# Patient Record
Sex: Female | Born: 1960
Health system: Southern US, Community
[De-identification: ages and names within clinical notes are randomized; demographics above are authoritative.]

## PROBLEM LIST (undated history)

## (undated) DIAGNOSIS — Z720 Tobacco use: Secondary | ICD-10-CM

## (undated) DIAGNOSIS — J449 Chronic obstructive pulmonary disease, unspecified: Secondary | ICD-10-CM

## (undated) DIAGNOSIS — I213 ST elevation (STEMI) myocardial infarction of unspecified site: Secondary | ICD-10-CM

## (undated) DIAGNOSIS — I10 Essential (primary) hypertension: Secondary | ICD-10-CM

## (undated) DIAGNOSIS — E785 Hyperlipidemia, unspecified: Secondary | ICD-10-CM

---

## 2003-06-13 ENCOUNTER — Emergency Department (HOSPITAL_COMMUNITY): Admission: EM | Admit: 2003-06-13 | Discharge: 2003-06-13 | Payer: Self-pay | Admitting: Emergency Medicine

## 2005-06-30 ENCOUNTER — Emergency Department (HOSPITAL_COMMUNITY): Admission: EM | Admit: 2005-06-30 | Discharge: 2005-06-30 | Payer: Self-pay | Admitting: Family Medicine

## 2006-06-14 ENCOUNTER — Emergency Department (HOSPITAL_COMMUNITY): Admission: EM | Admit: 2006-06-14 | Discharge: 2006-06-14 | Payer: Self-pay | Admitting: Emergency Medicine

## 2009-12-26 ENCOUNTER — Emergency Department (HOSPITAL_COMMUNITY): Admission: EM | Admit: 2009-12-26 | Discharge: 2009-12-26 | Payer: Self-pay | Admitting: Emergency Medicine

## 2010-01-05 ENCOUNTER — Emergency Department (HOSPITAL_COMMUNITY): Admission: EM | Admit: 2010-01-05 | Discharge: 2010-01-05 | Payer: Self-pay | Admitting: Emergency Medicine

## 2011-04-22 ENCOUNTER — Emergency Department (HOSPITAL_COMMUNITY)
Admission: EM | Admit: 2011-04-22 | Discharge: 2011-04-23 | Disposition: A | Discharge status: Home / Self Care | Payer: Self-pay | Attending: Emergency Medicine | Admitting: Emergency Medicine

## 2011-04-22 ENCOUNTER — Emergency Department (HOSPITAL_COMMUNITY): Payer: Self-pay

## 2011-04-22 ENCOUNTER — Other Ambulatory Visit: Payer: Self-pay

## 2011-04-22 ENCOUNTER — Encounter: Payer: Self-pay | Admitting: *Deleted

## 2011-04-22 DIAGNOSIS — R0602 Shortness of breath: Secondary | ICD-10-CM | POA: Insufficient documentation

## 2011-04-22 DIAGNOSIS — R05 Cough: Secondary | ICD-10-CM | POA: Insufficient documentation

## 2011-04-22 DIAGNOSIS — R079 Chest pain, unspecified: Secondary | ICD-10-CM | POA: Insufficient documentation

## 2011-04-22 DIAGNOSIS — J40 Bronchitis, not specified as acute or chronic: Secondary | ICD-10-CM | POA: Insufficient documentation

## 2011-04-22 DIAGNOSIS — R059 Cough, unspecified: Secondary | ICD-10-CM | POA: Insufficient documentation

## 2011-04-22 LAB — DIFFERENTIAL
Eosinophils Relative: 0 % (ref 0–5)
Lymphocytes Relative: 10 % — ABNORMAL LOW (ref 12–46)
Lymphs Abs: 0.8 10*3/uL (ref 0.7–4.0)
Neutro Abs: 6.6 10*3/uL (ref 1.7–7.7)

## 2011-04-22 LAB — CBC
HCT: 47.4 % — ABNORMAL HIGH (ref 36.0–46.0)
MCV: 91.7 fL (ref 78.0–100.0)
Platelets: 248 10*3/uL (ref 150–400)
RBC: 5.17 MIL/uL — ABNORMAL HIGH (ref 3.87–5.11)
WBC: 8.2 10*3/uL (ref 4.0–10.5)

## 2011-04-22 NOTE — ED Notes (Signed)
Pt in c/o cough and shortness of breath since yesterday, states coughing up green sputum, pt speaking in full sentences, VS stable, no acute distress

## 2011-04-23 LAB — COMPREHENSIVE METABOLIC PANEL
ALT: 28 U/L (ref 0–35)
AST: 59 U/L — ABNORMAL HIGH (ref 0–37)
Alkaline Phosphatase: 119 U/L — ABNORMAL HIGH (ref 39–117)
CO2: 22 mEq/L (ref 19–32)
Calcium: 9.3 mg/dL (ref 8.4–10.5)
Chloride: 100 mEq/L (ref 96–112)
GFR calc Af Amer: 88 mL/min — ABNORMAL LOW (ref >90)
GFR calc non Af Amer: 76 mL/min — ABNORMAL LOW (ref >90)
Glucose, Bld: 104 mg/dL — ABNORMAL HIGH (ref 70–99)
Sodium: 133 mEq/L — ABNORMAL LOW (ref 135–145)
Total Bilirubin: 0.2 mg/dL — ABNORMAL LOW (ref 0.3–1.2)

## 2011-04-23 MED ORDER — ALBUTEROL SULFATE HFA 108 (90 BASE) MCG/ACT IN AERS
2.0000 | INHALATION_SPRAY | RESPIRATORY_TRACT | Status: DC | PRN
  Filled 2011-04-23: qty 6.7

## 2011-04-23 MED ORDER — ALBUTEROL SULFATE (5 MG/ML) 0.5% IN NEBU
5.0000 mg | INHALATION_SOLUTION | Freq: Once | RESPIRATORY_TRACT | Status: AC
  Administered 2011-04-23: 5 mg via RESPIRATORY_TRACT
  Filled 2011-04-23 (×2): qty 0.5

## 2011-04-23 MED ORDER — IPRATROPIUM BROMIDE 0.02 % IN SOLN
0.5000 mg | Freq: Once | RESPIRATORY_TRACT | Status: AC
  Administered 2011-04-23: 0.5 mg via RESPIRATORY_TRACT
  Filled 2011-04-23: qty 2.5

## 2011-04-23 MED ORDER — ACETAMINOPHEN 325 MG PO TABS
650.0000 mg | ORAL_TABLET | Freq: Once | ORAL | Status: AC
  Administered 2011-04-23: 650 mg via ORAL
  Filled 2011-04-23: qty 2

## 2011-04-23 NOTE — ED Provider Notes (Signed)
History     CSN: [id number] Arrival date & time: 04/22/2011  8:48 PM   First MD Initiated Contact with Patient 04/23/11 0235      Chief Complaint  Patient presents with  . Cough    (Consider location/radiation/quality/duration/timing/severity/associated sxs/prior treatment) HPI This is a 50 year old black female with a three-day history of cough, shortness of breath, chest tightness, chest soreness, fever, body aches, posttussive emesis and diarrhea. The cough or shortness of breath are moderate to severe and worsened since yesterday. It has not been relieved by over-the-counter medications.  History reviewed. No pertinent past medical history.  History reviewed. No pertinent past surgical history.  History reviewed. No pertinent family history.  History  Substance Use Topics  . Smoking status: Current Everyday Smoker  . Smokeless tobacco: Not on file  . Alcohol Use: Yes    OB History    Grav Para Term Preterm Abortions TAB SAB Ect Mult Living                  Review of Systems  All other systems reviewed and are negative.     Allergies  Review of patient's allergies indicates no known allergies.  Home Medications   Current Outpatient Rx  Name Route Sig Dispense Refill  . ACETAMINOPHEN 325 MG PO TABS Oral Take 325 mg by mouth every 6 (six) hours as needed. Pain relief     . DEXTROMETHORPHAN HBR 15 MG/5ML PO SYRP Oral Take 10 mLs by mouth 4 (four) times daily as needed.      Marland Kitchen DM-GUAIFENESIN ER 30-600 MG PO TB12 Oral Take 1 tablet by mouth every 12 (twelve) hours.        BP 139/99  Pulse 105  Temp(Src) 100 F (37.8 C) (Oral)  Resp 24  SpO2 99%  Physical Exam General: Well-developed, well-nourished female in no acute distress; appears older than age of record HENT: normocephalic, atraumatic Eyes: pupils equal round and reactive to light; extraocular muscles intact; periocular xanthomas Neck: supple Heart: regular rate and rhythm Lungs: Decreased air  movement without frank wheezing Chest: Chest tenderness on palpation of sternum and parasternal regions Abdomen: soft; nontender; nondistended Extremities: No deformity; full range of motion; pulses normal Neurologic: Awake, alert; motor function intact in all extremities and symmetric; no facial droop Skin: Warm and dry    ED Course  Procedures (including critical care time)    MDM   Nursing notes and vitals signs, including pulse oximetry, reviewed.  Summary of this visit's results, reviewed by myself:  Labs:  Results for orders placed during the hospital encounter of 04/22/11  CBC      Component Value Range   WBC 8.2  4.0 - 10.5 (K/uL)   RBC 5.17 (*) 3.87 - 5.11 (MIL/uL)   Hemoglobin 16.3 (*) 12.0 - 15.0 (g/dL)   HCT 86.5 (*) 78.4 - 46.0 (%)   MCV 91.7  78.0 - 100.0 (fL)   MCH 31.5  26.0 - 34.0 (pg)   MCHC 34.4  30.0 - 36.0 (g/dL)   RDW 69.6  29.5 - 28.4 (%)   Platelets 248  150 - 400 (K/uL)  DIFFERENTIAL      Component Value Range   Neutrophils Relative 80 (*) 43 - 77 (%)   Neutro Abs 6.6  1.7 - 7.7 (K/uL)   Lymphocytes Relative 10 (*) 12 - 46 (%)   Lymphs Abs 0.8  0.7 - 4.0 (K/uL)   Monocytes Relative 9  3 - 12 (%)   Monocytes Absolute  0.8  0.1 - 1.0 (K/uL)   Eosinophils Relative 0  0 - 5 (%)   Eosinophils Absolute 0.0  0.0 - 0.7 (K/uL)   Basophils Relative 0  0 - 1 (%)   Basophils Absolute 0.0  0.0 - 0.1 (K/uL)  COMPREHENSIVE METABOLIC PANEL      Component Value Range   Sodium 133 (*) 135 - 145 (mEq/L)   Potassium 3.8  3.5 - 5.1 (mEq/L)   Chloride 100  96 - 112 (mEq/L)   CO2 22  19 - 32 (mEq/L)   Glucose, Bld 104 (*) 70 - 99 (mg/dL)   BUN 8  6 - 23 (mg/dL)   Creatinine, Ser 3.08  0.50 - 1.10 (mg/dL)   Calcium 9.3  8.4 - 65.7 (mg/dL)   Total Protein 8.3  6.0 - 8.3 (g/dL)   Albumin 4.0  3.5 - 5.2 (g/dL)   AST 59 (*) 0 - 37 (U/L)   ALT 28  0 - 35 (U/L)   Alkaline Phosphatase 119 (*) 39 - 117 (U/L)   Total Bilirubin 0.2 (*) 0.3 - 1.2 (mg/dL)   GFR calc  non Af Amer 76 (*) >90 (mL/min)   GFR calc Af Amer 88 (*) >90 (mL/min)    Imaging Studies: Dg Chest 2 View  04/22/2011  *RADIOLOGY REPORT*  Clinical Data: Shortness of breath.  Chest pain and cough.  CHEST - 2 VIEW  Comparison: None.  Findings: The lungs are clear without focal consolidation, edema, effusion or pneumothorax.  Cardiopericardial silhouette is within normal limits for size.  Imaged bony structures of the thorax are intact.  IMPRESSION: No acute findings.  Original Report Authenticated By: ERIC A. MANSELL, M.D.   3:43 AM Air movement improved, patient feels much better after albuterol and Atrovent neb treatment.          Hanley Seamen, MD 04/23/11 [phone]

## 2012-08-23 ENCOUNTER — Emergency Department (HOSPITAL_COMMUNITY)
Admission: EM | Admit: 2012-08-23 | Discharge: 2012-08-23 | Disposition: A | Discharge status: Home / Self Care | Payer: Self-pay | Attending: Emergency Medicine | Admitting: Emergency Medicine

## 2012-08-23 ENCOUNTER — Encounter (HOSPITAL_COMMUNITY): Payer: Self-pay | Admitting: Emergency Medicine

## 2012-08-23 ENCOUNTER — Emergency Department (HOSPITAL_COMMUNITY): Payer: Self-pay

## 2012-08-23 DIAGNOSIS — R079 Chest pain, unspecified: Secondary | ICD-10-CM | POA: Insufficient documentation

## 2012-08-23 DIAGNOSIS — J9801 Acute bronchospasm: Secondary | ICD-10-CM

## 2012-08-23 DIAGNOSIS — R05 Cough: Secondary | ICD-10-CM | POA: Insufficient documentation

## 2012-08-23 DIAGNOSIS — R0602 Shortness of breath: Secondary | ICD-10-CM | POA: Insufficient documentation

## 2012-08-23 DIAGNOSIS — J40 Bronchitis, not specified as acute or chronic: Secondary | ICD-10-CM

## 2012-08-23 DIAGNOSIS — R059 Cough, unspecified: Secondary | ICD-10-CM | POA: Insufficient documentation

## 2012-08-23 DIAGNOSIS — J209 Acute bronchitis, unspecified: Secondary | ICD-10-CM | POA: Insufficient documentation

## 2012-08-23 DIAGNOSIS — R062 Wheezing: Secondary | ICD-10-CM | POA: Insufficient documentation

## 2012-08-23 DIAGNOSIS — R093 Abnormal sputum: Secondary | ICD-10-CM | POA: Insufficient documentation

## 2012-08-23 DIAGNOSIS — F17200 Nicotine dependence, unspecified, uncomplicated: Secondary | ICD-10-CM | POA: Insufficient documentation

## 2012-08-23 DIAGNOSIS — R1110 Vomiting, unspecified: Secondary | ICD-10-CM | POA: Insufficient documentation

## 2012-08-23 DIAGNOSIS — Z79899 Other long term (current) drug therapy: Secondary | ICD-10-CM | POA: Insufficient documentation

## 2012-08-23 LAB — CBC WITH DIFFERENTIAL/PLATELET
Basophils Absolute: 0 10*3/uL (ref 0.0–0.1)
Basophils Relative: 0 % (ref 0–1)
Eosinophils Absolute: 0.2 10*3/uL (ref 0.0–0.7)
Hemoglobin: 16.7 g/dL — ABNORMAL HIGH (ref 12.0–15.0)
MCH: 33.6 pg (ref 26.0–34.0)
MCHC: 36.5 g/dL — ABNORMAL HIGH (ref 30.0–36.0)
Neutro Abs: 3.8 10*3/uL (ref 1.7–7.7)
Neutrophils Relative %: 66 % (ref 43–77)
Platelets: 288 10*3/uL (ref 150–400)
RDW: 14.2 % (ref 11.5–15.5)

## 2012-08-23 LAB — COMPREHENSIVE METABOLIC PANEL
AST: 20 U/L (ref 0–37)
Albumin: 3.8 g/dL (ref 3.5–5.2)
Alkaline Phosphatase: 112 U/L (ref 39–117)
Chloride: 106 mEq/L (ref 96–112)
Potassium: 3.9 mEq/L (ref 3.5–5.1)
Sodium: 142 mEq/L (ref 135–145)
Total Bilirubin: 0.2 mg/dL — ABNORMAL LOW (ref 0.3–1.2)
Total Protein: 7.4 g/dL (ref 6.0–8.3)

## 2012-08-23 LAB — POCT I-STAT TROPONIN I: Troponin i, poc: 0 ng/mL (ref 0.00–0.08)

## 2012-08-23 MED ORDER — GUAIFENESIN-CODEINE 100-10 MG/5ML PO SYRP: 5.0000 mL | ORAL_SOLUTION | Freq: Three times a day (TID) | ORAL | Status: DC | PRN

## 2012-08-23 MED ORDER — PREDNISONE 20 MG PO TABS
60.0000 mg | ORAL_TABLET | Freq: Once | ORAL | Status: AC
  Administered 2012-08-23: 60 mg via ORAL
  Filled 2012-08-23: qty 3

## 2012-08-23 MED ORDER — ALBUTEROL SULFATE HFA 108 (90 BASE) MCG/ACT IN AERS
2.0000 | INHALATION_SPRAY | Freq: Once | RESPIRATORY_TRACT | Status: AC
  Administered 2012-08-23: 2 via RESPIRATORY_TRACT
  Filled 2012-08-23: qty 6.7

## 2012-08-23 MED ORDER — GUAIFENESIN-CODEINE 100-10 MG/5ML PO SOLN
5.0000 mL | Freq: Once | ORAL | Status: AC
  Administered 2012-08-23: 5 mL via ORAL
  Filled 2012-08-23: qty 5

## 2012-08-23 MED ORDER — ALBUTEROL SULFATE (5 MG/ML) 0.5% IN NEBU
5.0000 mg | INHALATION_SOLUTION | Freq: Once | RESPIRATORY_TRACT | Status: AC
  Administered 2012-08-23: 5 mg via RESPIRATORY_TRACT
  Filled 2012-08-23: qty 1

## 2012-08-23 MED ORDER — IPRATROPIUM BROMIDE 0.02 % IN SOLN
0.5000 mg | Freq: Once | RESPIRATORY_TRACT | Status: AC
  Administered 2012-08-23: 0.5 mg via RESPIRATORY_TRACT
  Filled 2012-08-23: qty 2.5

## 2012-08-23 MED ORDER — AZITHROMYCIN 250 MG PO TABS: ORAL_TABLET | ORAL | Status: DC

## 2012-08-23 NOTE — ED Provider Notes (Signed)
History     CSN: [id number]  Arrival date & time 08/23/12  1945   First MD Initiated Contact with Patient 08/23/12 1955      Chief Complaint  Patient presents with  . Cough  . Chest Pain    (Consider location/radiation/quality/duration/timing/severity/associated sxs/prior treatment) HPI Comments: 52 y/o female with no significant PMHx presents to the ED complaining of persistent productive cough with green mucus, wheezing and shortness of breath x 4 days. States she occasionally coughs so much it causes her to vomit. Denies chest pain, just soreness and tightness with coughing. No history of asthma. She tried applying a warm moist cloth over her face without relief. Denies fever, chills, nausea, abdominal pain, lightheadedness, dizziness. No sick contacts.  Patient is a 52 y.o. female presenting with cough and chest pain. The history is provided by the patient.  Cough Associated symptoms: shortness of breath and wheezing   Associated symptoms: no chest pain, no chills and no fever   Chest Pain Associated symptoms: cough, shortness of breath and vomiting   Associated symptoms: no abdominal pain, no dizziness, no fever and no nausea     History reviewed. No pertinent past medical history.  History reviewed. No pertinent past surgical history.  No family history on file.  History  Substance Use Topics  . Smoking status: Current Every Day Smoker  . Smokeless tobacco: Not on file  . Alcohol Use: Yes    OB History   Grav Para Term Preterm Abortions TAB SAB Ect Mult Living                  Review of Systems  Constitutional: Negative for fever and chills.  Respiratory: Positive for cough, chest tightness, shortness of breath and wheezing.   Cardiovascular: Negative for chest pain.  Gastrointestinal: Positive for vomiting. Negative for nausea and abdominal pain.  Neurological: Negative for dizziness and light-headedness.  All other systems reviewed and are  negative.    Allergies  Review of patient's allergies indicates no known allergies.  Home Medications   Current Outpatient Rx  Name  Route  Sig  Dispense  Refill  . acetaminophen (TYLENOL) 325 MG tablet   Oral   Take 325 mg by mouth every 6 (six) hours as needed. Pain relief          . dextromethorphan 15 MG/5ML syrup   Oral   Take 10 mLs by mouth 4 (four) times daily as needed.           Marland Kitchen dextromethorphan-guaiFENesin (MUCINEX DM) 30-600 MG per 12 hr tablet   Oral   Take 1 tablet by mouth every 12 (twelve) hours.             BP 124/89  Pulse 97  Temp(Src) 98.7 F (37.1 C) (Oral)  Resp 18  SpO2 98%  Physical Exam  Nursing note and vitals reviewed. Constitutional: She is oriented to person, place, and time. She appears well-developed and well-nourished.  Harsh wet sounding cough present throughout exam, tearful.  HENT:  Head: Normocephalic and atraumatic.  Mouth/Throat: Posterior oropharyngeal erythema present. No oropharyngeal exudate or posterior oropharyngeal edema.  Eyes: Conjunctivae are normal.  Neck: Normal range of motion. Neck supple.  Cardiovascular: Normal rate, regular rhythm, normal heart sounds and intact distal pulses.   Pulmonary/Chest: Effort normal. No accessory muscle usage. No respiratory distress. She has no decreased breath sounds. She has wheezes (scattered inspiratory and expiratory). She has rhonchi (scattered).  Abdominal: Soft. Bowel sounds are normal. There is  no tenderness.  Musculoskeletal: Normal range of motion. She exhibits no edema.  Lymphadenopathy:    She has no cervical adenopathy.  Neurological: She is alert and oriented to person, place, and time.  Skin: Skin is warm and dry. She is not diaphoretic.  Psychiatric: She has a normal mood and affect. Her speech is normal and behavior is normal.    ED Course  Procedures (including critical care time)  Labs Reviewed  CBC WITH DIFFERENTIAL - Abnormal; Notable for the  following:    Hemoglobin 16.7 (*)    MCHC 36.5 (*)    All other components within normal limits  COMPREHENSIVE METABOLIC PANEL - Abnormal; Notable for the following:    Total Bilirubin 0.2 (*)    GFR calc non Af Amer 75 (*)    GFR calc Af Amer 87 (*)    All other components within normal limits  POCT I-STAT TROPONIN I    Date: 08/23/2012  Rate: 100  Rhythm: sinus tachycardia  QRS Axis: normal  Intervals: normal  ST/T Wave abnormalities: nonspecific T wave changes  Conduction Disutrbances:none  Narrative Interpretation: no changes since last EKG tracing 04/2011  Old EKG Reviewed: unchanged   Dg Chest 2 View  08/23/2012  *RADIOLOGY REPORT*  Clinical Data: Cough, chest pain  CHEST - 2 VIEW  Comparison: 04/22/2011  Findings: Cardiomediastinal silhouette is stable.  No acute infiltrate or pleural effusion No pulmonary edema.  Central mild bronchitic changes.  Bony thorax is stable.  IMPRESSION: No acute infiltrate or pulmonary edema.  Central mild bronchitic changes.   Original Report Authenticated By: Natasha Mead, M.D.      1. Bronchitis   2. Bronchospasm   3. Cough       MDM  52 y/o female with cough and wheezing. Inspiratory/expiratory wheezes and ronchi present on exam. Actively coughing with harsh, wet sounding cough. Will give duo-neb, prednisone along with guaifenesin for cough and re-assess. 9:58 PM Marked clinical improvement noted with breathing treatment, prednisone and guaifenesin. Decreased wheezes/ronchi on exam, however still mildly present scattered. Vitals stable. Afebrile. Rx albuterol inhaler, zithromax, guaifenesin. Return precautions discussed. Resource guide given for PCP follow up. Patient states understanding of plan and is agreeable.        Trevor Mace, PA-C 08/23/12 2159

## 2012-08-23 NOTE — ED Notes (Signed)
PT. REPORTS PERSISTENT PRODUCTIVE COUGH WITH SOB AND MID CHEST PAIN FOR SEVERAL DAYS , CHEST PAIN WORSE WHEN COUGHING , ALSO REPORTS OCCASIONAL EMESIS.

## 2012-08-25 NOTE — ED Provider Notes (Signed)
Medical screening examination/treatment/procedure(s) were performed by non-physician practitioner and as supervising physician I was immediately available for consultation/collaboration.   Laray Anger, DO 08/25/12 [phone]

## 2013-06-01 ENCOUNTER — Emergency Department (HOSPITAL_COMMUNITY)
Admission: EM | Admit: 2013-06-01 | Discharge: 2013-06-02 | Disposition: A | Discharge status: Home / Self Care | Payer: Self-pay | Attending: Emergency Medicine | Admitting: Emergency Medicine

## 2013-06-01 ENCOUNTER — Encounter (HOSPITAL_COMMUNITY): Payer: Self-pay | Admitting: Emergency Medicine

## 2013-06-01 DIAGNOSIS — K5289 Other specified noninfective gastroenteritis and colitis: Secondary | ICD-10-CM | POA: Insufficient documentation

## 2013-06-01 DIAGNOSIS — R109 Unspecified abdominal pain: Secondary | ICD-10-CM

## 2013-06-01 DIAGNOSIS — F17200 Nicotine dependence, unspecified, uncomplicated: Secondary | ICD-10-CM | POA: Insufficient documentation

## 2013-06-01 DIAGNOSIS — K529 Noninfective gastroenteritis and colitis, unspecified: Secondary | ICD-10-CM

## 2013-06-01 LAB — COMPREHENSIVE METABOLIC PANEL
ALT: 17 U/L (ref 0–35)
AST: 15 U/L (ref 0–37)
Albumin: 3.8 g/dL (ref 3.5–5.2)
Alkaline Phosphatase: 116 U/L (ref 39–117)
BUN: 11 mg/dL (ref 6–23)
CHLORIDE: 99 meq/L (ref 96–112)
CO2: 22 meq/L (ref 19–32)
Calcium: 9.5 mg/dL (ref 8.4–10.5)
Creatinine, Ser: 0.83 mg/dL (ref 0.50–1.10)
GFR calc Af Amer: >90 mL/min (ref >90)
GFR, EST NON AFRICAN AMERICAN: 79 mL/min — AB (ref >90)
Glucose, Bld: 110 mg/dL — ABNORMAL HIGH (ref 70–99)
Potassium: 4.1 mEq/L (ref 3.7–5.3)
SODIUM: 138 meq/L (ref 137–147)
Total Protein: 8.3 g/dL (ref 6.0–8.3)

## 2013-06-01 LAB — CBC WITH DIFFERENTIAL/PLATELET
Basophils Absolute: 0 10*3/uL (ref 0.0–0.1)
Basophils Relative: 0 % (ref 0–1)
Eosinophils Absolute: 0.1 10*3/uL (ref 0.0–0.7)
Eosinophils Relative: 1 % (ref 0–5)
HCT: 47.5 % — ABNORMAL HIGH (ref 36.0–46.0)
Hemoglobin: 16.9 g/dL — ABNORMAL HIGH (ref 12.0–15.0)
LYMPHS PCT: 17 % (ref 12–46)
Lymphs Abs: 1.7 10*3/uL (ref 0.7–4.0)
MCH: 32.9 pg (ref 26.0–34.0)
MCHC: 35.6 g/dL (ref 30.0–36.0)
MCV: 92.6 fL (ref 78.0–100.0)
Monocytes Absolute: 0.8 10*3/uL (ref 0.1–1.0)
Monocytes Relative: 8 % (ref 3–12)
NEUTROS ABS: 7.3 10*3/uL (ref 1.7–7.7)
NEUTROS PCT: 73 % (ref 43–77)
PLATELETS: 334 10*3/uL (ref 150–400)
RBC: 5.13 MIL/uL — ABNORMAL HIGH (ref 3.87–5.11)
RDW: 13.7 % (ref 11.5–15.5)
WBC: 10 10*3/uL (ref 4.0–10.5)

## 2013-06-01 LAB — URINALYSIS, ROUTINE W REFLEX MICROSCOPIC
Bilirubin Urine: NEGATIVE
GLUCOSE, UA: NEGATIVE mg/dL
KETONES UR: NEGATIVE mg/dL
Nitrite: NEGATIVE
Protein, ur: NEGATIVE mg/dL
Specific Gravity, Urine: 1.022 (ref 1.005–1.030)
Urobilinogen, UA: 1 mg/dL (ref 0.0–1.0)
pH: 5.5 (ref 5.0–8.0)

## 2013-06-01 LAB — URINE MICROSCOPIC-ADD ON

## 2013-06-01 LAB — LACTIC ACID, PLASMA: LACTIC ACID, VENOUS: 1.1 mmol/L (ref 0.5–2.2)

## 2013-06-01 LAB — LIPASE, BLOOD: Lipase: 21 U/L (ref 11–59)

## 2013-06-01 MED ORDER — MORPHINE SULFATE 4 MG/ML IJ SOLN
4.0000 mg | Freq: Once | INTRAMUSCULAR | Status: AC
  Administered 2013-06-01: 4 mg via INTRAVENOUS
  Filled 2013-06-01: qty 1

## 2013-06-01 MED ORDER — ONDANSETRON HCL 4 MG/2ML IJ SOLN
4.0000 mg | Freq: Once | INTRAMUSCULAR | Status: AC
  Administered 2013-06-01: 4 mg via INTRAVENOUS
  Filled 2013-06-01: qty 2

## 2013-06-01 MED ORDER — FAMOTIDINE IN NACL 20-0.9 MG/50ML-% IV SOLN
20.0000 mg | Freq: Once | INTRAVENOUS | Status: AC
  Administered 2013-06-01: 20 mg via INTRAVENOUS
  Filled 2013-06-01: qty 50

## 2013-06-01 MED ORDER — SODIUM CHLORIDE 0.9 % IV BOLUS (SEPSIS)
1000.0000 mL | Freq: Once | INTRAVENOUS | Status: AC
  Administered 2013-06-01: 1000 mL via INTRAVENOUS

## 2013-06-01 NOTE — ED Provider Notes (Signed)
CSN: [id number]     Arrival date & time 06/01/13  1925 History   First MD Initiated Contact with Patient 06/01/13 2257     Chief Complaint  Patient presents with  . Abdominal Pain   (Consider location/radiation/quality/duration/timing/severity/associated sxs/prior Treatment) HPI Patient is a 53 yo woman with PMH significant only for asthma. She presents with c/o 4 days of RLQ abdominal pain. Pain was initially intermittent. But, has been constant and more severe over the past 2 days. Pain is severe. It is nonradiating. The patient feels worse when she lays down and better when she is sitting up. She has been intermittently nauseated and had a single episode of NBNB emesis today.   Her last BM was 4d ago. She notes she has been passing quite a bit of flatus. She denies history of similar sx. No history of abdominal surgeries. Patient denies GU sx.   History reviewed. No pertinent past medical history. History reviewed. No pertinent past surgical history. No family history on file. History  Substance Use Topics  . Smoking status: Current Every Day Smoker -- 0.50 packs/day    Types: Cigarettes  . Smokeless tobacco: Not on file  . Alcohol Use: Yes   OB History   Grav Para Term Preterm Abortions TAB SAB Ect Mult Living                 Review of Systems Ten point review of symptoms performed and is negative with the exception of symptoms noted above.   Allergies  Review of patient's allergies indicates no known allergies.  Home Medications   Current Outpatient Rx  Name  Route  Sig  Dispense  Refill  . acetaminophen (TYLENOL) 325 MG tablet   Oral   Take 325 mg by mouth every 6 (six) hours as needed. Pain relief          . albuterol (PROVENTIL HFA;VENTOLIN HFA) 108 (90 BASE) MCG/ACT inhaler   Inhalation   Inhale 2 puffs into the lungs every 6 (six) hours as needed for wheezing.         Marland Kitchen ibuprofen (ADVIL,MOTRIN) 200 MG tablet   Oral   Take 600 mg by mouth every 6 (six)  hours as needed for pain.          BP 139/86  Pulse 107  Temp(Src) 97.6 F (36.4 C) (Oral)  Resp 18  Ht 5\' 1"  (1.549 m)  Wt 149 lb 12.8 oz (67.949 kg)  BMI 28.32 kg/m2  SpO2 99% Physical Exam Gen: well developed and well nourished appearing Head: NCAT Eyes: PERL, EOMI Nose: no epistaixis or rhinorrhea Mouth/throat: mucosa is moist and pink Neck: supple, no stridor Lungs: CTA B, no wheezing, rhonchi or rales CV: Rapid and regular, pulse 104 bpm, no murmur, extremities appear well perfused.  Abd: soft, tender over the RLQ and, to a lesser extent, over the RUQ, obese, nondistended Back: no ttp, no cva ttp Skin: warm and dry Ext: normal to inspection, no dependent edema Neuro: CN ii-xii grossly intact, no focal deficits Psyche; normal affect,  calm and cooperative.   ED Course  Procedures (including critical care time) Labs Review  Results for orders placed during the hospital encounter of 06/01/13 (from the past 24 hour(s))  CBC WITH DIFFERENTIAL     Status: Abnormal   Collection Time    06/01/13  7:45 PM      Result Value Range   WBC 10.0  4.0 - 10.5 K/uL   RBC 5.13 (*) 3.87 -  5.11 MIL/uL   Hemoglobin 16.9 (*) 12.0 - 15.0 g/dL   HCT 46.5 (*) 03.5 - 46.5 %   MCV 92.6  78.0 - 100.0 fL   MCH 32.9  26.0 - 34.0 pg   MCHC 35.6  30.0 - 36.0 g/dL   RDW 68.1  27.5 - 17.0 %   Platelets 334  150 - 400 K/uL   Neutrophils Relative % 73  43 - 77 %   Neutro Abs 7.3  1.7 - 7.7 K/uL   Lymphocytes Relative 17  12 - 46 %   Lymphs Abs 1.7  0.7 - 4.0 K/uL   Monocytes Relative 8  3 - 12 %   Monocytes Absolute 0.8  0.1 - 1.0 K/uL   Eosinophils Relative 1  0 - 5 %   Eosinophils Absolute 0.1  0.0 - 0.7 K/uL   Basophils Relative 0  0 - 1 %   Basophils Absolute 0.0  0.0 - 0.1 K/uL  COMPREHENSIVE METABOLIC PANEL     Status: Abnormal   Collection Time    06/01/13  7:45 PM      Result Value Range   Sodium 138  137 - 147 mEq/L   Potassium 4.1  3.7 - 5.3 mEq/L   Chloride 99  96 - 112  mEq/L   CO2 22  19 - 32 mEq/L   Glucose, Bld 110 (*) 70 - 99 mg/dL   BUN 11  6 - 23 mg/dL   Creatinine, Ser 0.17  0.50 - 1.10 mg/dL   Calcium 9.5  8.4 - 49.4 mg/dL   Total Protein 8.3  6.0 - 8.3 g/dL   Albumin 3.8  3.5 - 5.2 g/dL   AST 15  0 - 37 U/L   ALT 17  0 - 35 U/L   Alkaline Phosphatase 116  39 - 117 U/L   Total Bilirubin <0.2 (*) 0.3 - 1.2 mg/dL   GFR calc non Af Amer 79 (*) >90 mL/min   GFR calc Af Amer >90  >90 mL/min  LIPASE, BLOOD     Status: None   Collection Time    06/01/13  7:45 PM      Result Value Range   Lipase 21  11 - 59 U/L  URINALYSIS, ROUTINE W REFLEX MICROSCOPIC     Status: Abnormal   Collection Time    06/01/13 10:36 PM      Result Value Range   Color, Urine YELLOW  YELLOW   APPearance CLOUDY (*) CLEAR   Specific Gravity, Urine 1.022  1.005 - 1.030   pH 5.5  5.0 - 8.0   Glucose, UA NEGATIVE  NEGATIVE mg/dL   Hgb urine dipstick SMALL (*) NEGATIVE   Bilirubin Urine NEGATIVE  NEGATIVE   Ketones, ur NEGATIVE  NEGATIVE mg/dL   Protein, ur NEGATIVE  NEGATIVE mg/dL   Urobilinogen, UA 1.0  0.0 - 1.0 mg/dL   Nitrite NEGATIVE  NEGATIVE   Leukocytes, UA MODERATE (*) NEGATIVE  URINE MICROSCOPIC-ADD ON     Status: Abnormal   Collection Time    06/01/13 10:36 PM      Result Value Range   Squamous Epithelial / LPF FEW (*) RARE   WBC, UA 11-20  <3 WBC/hpf   RBC / HPF 0-2  <3 RBC/hpf   Bacteria, UA FEW (*) RARE   Urine-Other MUCOUS PRESENT    LACTIC ACID, PLASMA     Status: None   Collection Time    06/01/13 11:22 PM  Result Value Range   Lactic Acid, Venous 1.1  0.5 - 2.2 mmol/L   CT Abdomen Pelvis W Contrast (Final result)  Result time: 06/02/13 01:11:01    Final result by Rad Results In Interface (06/02/13 01:11:01)    Narrative:   CLINICAL DATA: Right lower quadrant pain. Nausea vomiting and constipation.  EXAM: CT ABDOMEN AND PELVIS WITH CONTRAST  TECHNIQUE: Multidetector CT imaging of the abdomen and pelvis was performed using the  standard protocol following bolus administration of intravenous contrast.  CONTRAST: 100mL OMNIPAQUE IOHEXOL 300 MG/ML SOLN  COMPARISON: None.  FINDINGS: Lower Chest: Mild bibasilar atelectasis. Mild cardiomegaly, without pericardial or pleural effusion.  Abdomen/Pelvis: Normal liver, spleen, stomach, pancreas, gallbladder, biliary tract, adrenal glands, kidneys. Aortic atherosclerosis. No retroperitoneal or retrocrural adenopathy. Focal wall thickening of and edema surrounding the ascending colon, just above the ileocecal valve. Scattered diverticula at in this area. The appendix arises just inferior to this, including on image 50/series 2 and image 73 coronal. No surrounding abscess or obstruction.  Normal small bowel without abdominal ascites.  No pelvic adenopathy. Large fibroid uterus, with a dominant fundal mass measuring 10.5 cm. Normal urinary bladder, without adnexal mass or significant free pelvic fluid.  Bones/Musculoskeletal: No acute osseous abnormality.  IMPRESSION: 1. Inflammatory process centered in the ascending colon, just above the ileocecal valve. Favor uncomplicated diverticulitis. Given the focality of the abnormality, clinical and possibly colonoscopy follow-up suggested to exclude underlying neoplasm. This is felt unlikely. 2. The appendix is positioned just inferior to this inflammatory process, but is otherwise normal. 3. Large fibroid uterus.   Electronically Signed By: Jeronimo GreavesKyle Talbot M.D. On: 06/02/2013 01:11     MDM  DDX: gastritis, PUD, GERD, pancreatitis, appendicitis, gallbladder disease, SBO, colitis, UTI, enteritis.   We will tx symptomatically and obtain labs and CT abd/pelvis to exclude appendicitis.   CT scan consistent with ascending colitis. Appendix visualized by Dr. Reche Dixonalbot of Radiology and interpreted as normal.  The patient is feeling better after symptomatic management in the ED. Anticipate tx with Cipro and Flagyl and plan for  d/c home with same and outpatient f/u with return precautions.   Brandt LoosenJulie Villa Burgin, MD 06/02/13 [phone]

## 2013-06-01 NOTE — ED Notes (Signed)
Pt states since Monday she has been having lower abd pain and has not been able to have a BM since Monday.  Pt states she was able to eat earlier today, but states it was just because she was starving.

## 2013-06-02 ENCOUNTER — Emergency Department (HOSPITAL_COMMUNITY): Payer: Self-pay

## 2013-06-02 ENCOUNTER — Encounter (HOSPITAL_COMMUNITY): Payer: Self-pay | Admitting: Radiology

## 2013-06-02 MED ORDER — CIPROFLOXACIN HCL 500 MG PO TABS
500.0000 mg | ORAL_TABLET | Freq: Once | ORAL | Status: AC
  Administered 2013-06-02: 500 mg via ORAL
  Filled 2013-06-02: qty 1

## 2013-06-02 MED ORDER — IOHEXOL 300 MG/ML  SOLN
100.0000 mL | Freq: Once | INTRAMUSCULAR | Status: AC | PRN
  Administered 2013-06-02: 100 mL via INTRAVENOUS

## 2013-06-02 MED ORDER — HYDROCODONE-ACETAMINOPHEN 5-325 MG PO TABS: 1.0000 | ORAL_TABLET | ORAL | Status: DC | PRN

## 2013-06-02 MED ORDER — METRONIDAZOLE 500 MG PO TABS
500.0000 mg | ORAL_TABLET | Freq: Once | ORAL | Status: AC
  Administered 2013-06-02: 500 mg via ORAL
  Filled 2013-06-02: qty 1

## 2013-06-02 MED ORDER — PROMETHAZINE HCL 25 MG PO TABS: 25.0000 mg | ORAL_TABLET | Freq: Four times a day (QID) | ORAL | Status: DC | PRN

## 2013-06-02 MED ORDER — METRONIDAZOLE 500 MG PO TABS: 500.0000 mg | ORAL_TABLET | Freq: Two times a day (BID) | ORAL | Status: DC

## 2013-06-02 MED ORDER — DOCUSATE SODIUM 100 MG PO CAPS: 100.0000 mg | ORAL_CAPSULE | Freq: Two times a day (BID) | ORAL | Status: DC

## 2013-06-02 MED ORDER — CIPROFLOXACIN HCL 500 MG PO TABS: 500.0000 mg | ORAL_TABLET | Freq: Two times a day (BID) | ORAL | Status: DC

## 2013-06-02 NOTE — ED Notes (Signed)
Patient transported to CT 

## 2013-06-02 NOTE — Discharge Instructions (Signed)
Abdominal Pain, Adult °Many things can cause abdominal pain. Usually, abdominal pain is not caused by a disease and will improve without treatment. It can often be observed and treated at home. Your health care provider will do a physical exam and possibly order blood tests and X-rays to help determine the seriousness of your pain. However, in many cases, more time must pass before a clear cause of the pain can be found. Before that point, your health care provider may not know if you need more testing or further treatment. °HOME CARE INSTRUCTIONS  °Monitor your abdominal pain for any changes. The following actions may help to alleviate any discomfort you are experiencing: °· Only take over-the-counter or prescription medicines as directed by your health care provider. °· Do not take laxatives unless directed to do so by your health care provider. °· Try a clear liquid diet (broth, tea, or water) as directed by your health care provider. Slowly move to a bland diet as tolerated. °SEEK MEDICAL CARE IF: °· You have unexplained abdominal pain. °· You have abdominal pain associated with nausea or diarrhea. °· You have pain when you urinate or have a bowel movement. °· You experience abdominal pain that wakes you in the night. °· You have abdominal pain that is worsened or improved by eating food. °· You have abdominal pain that is worsened with eating fatty foods. °SEEK IMMEDIATE MEDICAL CARE IF:  °· Your pain does not go away within 2 hours. °· You have a fever. °· You keep throwing up (vomiting). °· Your pain is felt only in portions of the abdomen, such as the right side or the left lower portion of the abdomen. °· You pass bloody or black tarry stools. °MAKE SURE YOU: °· Understand these instructions.   °· Will watch your condition.   °· Will get help right away if you are not doing well or get worse.   °Document Released: 02/04/2005 Document Revised: 02/15/2013 Document Reviewed: 01/04/2013 °[employer]® Patient  Information ©2014 [employer]. ° °

## 2013-06-03 LAB — URINE CULTURE
Colony Count: NO GROWTH
Culture: NO GROWTH

## 2016-05-12 ENCOUNTER — Emergency Department (HOSPITAL_COMMUNITY)
Admission: EM | Admit: 2016-05-12 | Discharge: 2016-05-12 | Disposition: A | Discharge status: Home / Self Care | Payer: Self-pay | Attending: Emergency Medicine | Admitting: Emergency Medicine

## 2016-05-12 ENCOUNTER — Emergency Department (HOSPITAL_COMMUNITY): Payer: Self-pay

## 2016-05-12 ENCOUNTER — Encounter (HOSPITAL_COMMUNITY): Payer: Self-pay | Admitting: Emergency Medicine

## 2016-05-12 DIAGNOSIS — J449 Chronic obstructive pulmonary disease, unspecified: Secondary | ICD-10-CM | POA: Insufficient documentation

## 2016-05-12 DIAGNOSIS — J40 Bronchitis, not specified as acute or chronic: Secondary | ICD-10-CM | POA: Insufficient documentation

## 2016-05-12 DIAGNOSIS — F17210 Nicotine dependence, cigarettes, uncomplicated: Secondary | ICD-10-CM | POA: Insufficient documentation

## 2016-05-12 HISTORY — DX: Chronic obstructive pulmonary disease, unspecified: J44.9

## 2016-05-12 LAB — BASIC METABOLIC PANEL
Anion gap: 11 (ref 5–15)
BUN: 8 mg/dL (ref 6–20)
CO2: 22 mmol/L (ref 22–32)
Calcium: 8.8 mg/dL — ABNORMAL LOW (ref 8.9–10.3)
Chloride: 107 mmol/L (ref 101–111)
Creatinine, Ser: 0.55 mg/dL (ref 0.44–1.00)
GFR calc Af Amer: >60 mL/min (ref >60)
GFR calc non Af Amer: >60 mL/min (ref >60)
Glucose, Bld: 142 mg/dL — ABNORMAL HIGH (ref 65–99)
Potassium: 3.4 mmol/L — ABNORMAL LOW (ref 3.5–5.1)
Sodium: 140 mmol/L (ref 135–145)

## 2016-05-12 LAB — CBC WITH DIFFERENTIAL/PLATELET
Basophils Absolute: 0 10*3/uL (ref 0.0–0.1)
Basophils Relative: 0 %
Eosinophils Absolute: 0.1 10*3/uL (ref 0.0–0.7)
Eosinophils Relative: 1 %
HCT: 46.2 % — ABNORMAL HIGH (ref 36.0–46.0)
Hemoglobin: 16.3 g/dL — ABNORMAL HIGH (ref 12.0–15.0)
Lymphocytes Relative: 15 %
Lymphs Abs: 1.1 10*3/uL (ref 0.7–4.0)
MCH: 32.3 pg (ref 26.0–34.0)
MCHC: 35.3 g/dL (ref 30.0–36.0)
MCV: 91.7 fL (ref 78.0–100.0)
Monocytes Absolute: 0.5 10*3/uL (ref 0.1–1.0)
Monocytes Relative: 8 %
Neutro Abs: 5.2 10*3/uL (ref 1.7–7.7)
Neutrophils Relative %: 76 %
Platelets: 263 10*3/uL (ref 150–400)
RBC: 5.04 MIL/uL (ref 3.87–5.11)
RDW: 14.2 % (ref 11.5–15.5)
WBC: 6.8 10*3/uL (ref 4.0–10.5)

## 2016-05-12 MED ORDER — MAGNESIUM SULFATE IN D5W 1-5 GM/100ML-% IV SOLN
1.0000 g | Freq: Once | INTRAVENOUS | Status: AC
  Administered 2016-05-12: 1 g via INTRAVENOUS
  Filled 2016-05-12: qty 100

## 2016-05-12 MED ORDER — PREDNISONE 20 MG PO TABS: 40.0000 mg | ORAL_TABLET | Freq: Every day | ORAL | 0 refills | Status: DC

## 2016-05-12 MED ORDER — PREDNISONE 20 MG PO TABS
40.0000 mg | ORAL_TABLET | Freq: Once | ORAL | Status: AC
  Administered 2016-05-12: 40 mg via ORAL
  Filled 2016-05-12: qty 2

## 2016-05-12 MED ORDER — ALBUTEROL SULFATE HFA 108 (90 BASE) MCG/ACT IN AERS
2.0000 | INHALATION_SPRAY | Freq: Once | RESPIRATORY_TRACT | Status: AC
  Administered 2016-05-12: 2 via RESPIRATORY_TRACT
  Filled 2016-05-12: qty 6.7

## 2016-05-12 MED ORDER — MAGNESIUM SULFATE 50 % IJ SOLN: 1.0000 g | Freq: Once | INTRAMUSCULAR | Status: DC

## 2016-05-12 NOTE — Progress Notes (Signed)
Entered in d/c instructions   Please use the resources provided to you in emergency room by case manager to assist you're your choice of doctor for follow up  These [place] uninsured resources provide possible primary care providers, resources for discounted medications, housing, dental resources, affordable care act information, plus other resources for [facility]   A referral for you has been sent to [employer] for community care network if you have not received a call in 3 days you may contact them Call Scherry Ran at [phone] Tuesday-Friday [email or web address] If immediate follow up services is needed Please call one of the doctors listed on the first two pages

## 2016-05-12 NOTE — ED Triage Notes (Signed)
Per EMS, pt is from home with a complaints of SOB. Pt has a hx of asthma and COPD. Pt used inhaler at home with no relief. EMS that pt has wheezing in all lung fields and pt has a productive cough. Pt reported taking delsym and mucinex. Pt received two neb tx from EMS. Total of 10 mg albuterol, 0.5 atrovent, and 125 mg of solu-medrol.

## 2016-05-12 NOTE — Progress Notes (Signed)
ED CM consulted by ED RN for pcp and financial assistance Cm discussed with ED RN CM does not provided financial assistance and referred her to [facility] ED registration staff ED CM reviewed briefly pt encounter chart information Pt not listed with insurance ED Cm spoke with pt and visitors to include 2 females and a female at bedside  CM spoke with pt who confirms uninsured [employer] resident with no pcp.  CM discussed and provided written information to assist pt with determining choice for uninsured accepting pcps, discussed the importance of pcp vs EDP services for f/u care, www.needymeds.org, www.goodrx.com, discounted pharmacies and other [employer] such as [employer] , [employer], affordable care act, financial assistance, uninsured dental services, [place] med assist, DSS and  health department  Reviewed resources for [employer] uninsured accepting pcps like [facility], family medicine at [employer], [facility], [facility], local urgent care centers, [facility], [facility] family practice, general medical clinics, family services of the [place], [facility] urgent care plus others, medication resources, [facility] out patient pharmacies and housing Pt voiced understanding and appreciation of resources provided   Provided [facility] contact information One female closer to pt had various questions and inquired if pt can get orange card today Cm informed her pt could not get orange card today from [facility] ED. Cm discussed with her [facility] ED CM does not work for [facility] and that a representative from [facility] comes to visit [facility] to see pts at intervals and is not here today but if she was pt would still need to get an appt with [facility] and complete [facility] application before she is determined to be a [facility] candidate and this does not happen in a day Encouraged to go to [facility] website provided to see what pt would need to take with her to [facility] appt Emphasized available resources for pt in uninsured resources    1220 ED CM spoke with [facility] ED registration staff to ask that pt be provided with financial assistance information

## 2016-05-12 NOTE — ED Notes (Signed)
Bed: WA01 Expected date:  Expected time:  Means of arrival:  Comments: 45F/SOB/Hx of COPD/neb tx

## 2016-05-19 NOTE — ED Provider Notes (Signed)
[facility]-EM[facility]Y DEPT Provider Note   CSN: [id number] Arrival date & time: 05/12/16  0859     History   Chief Complaint No chief complaint on file.   HPI Laurie Espinoza is a 56 y.o. female.  HPI    55yF with dyspnea. Pt has a hx of asthma and COPD. Pt used inhaler at home with no relief. EMS that pt has wheezing in all lung fields and pt has a productive cough. Pt reported taking delsym and mucinex. Pt received two neb tx from EMS. Total of 10 mg albuterol, 0.5 atrovent, and 125 mg of solu-medrol.  Past Medical History:  Diagnosis Date  . COPD (chronic obstructive pulmonary disease) (HCC)     There are no active problems to display for this patient.   No past surgical history on file.  OB History    No data available       Home Medications    Prior to Admission medications   Medication Sig Start Date End Date Taking? Authorizing Provider  GUAIFENESIN ER PO Take 600 mg by mouth 2 (two) times daily.   Yes Historical Provider, MD  ibuprofen (ADVIL,MOTRIN) 200 MG tablet Take 600 mg by mouth every 6 (six) hours as needed.   Yes Historical Provider, MD  Phenylephrine-DM-GG-APAP (DELSYM COUGH/COLD DAYTIME PO) Take 10 mLs by mouth as needed (cough).   Yes Historical Provider, MD  predniSONE (DELTASONE) 20 MG tablet Take 2 tablets (40 mg total) by mouth daily. 05/12/16   Laurie Razor, MD    Family History No family history on file.  Social History Social History  Substance Use Topics  . Smoking status: Current Every Day Smoker    Packs/day: 0.50    Types: Cigarettes  . Smokeless tobacco: Not on file  . Alcohol use Yes     Allergies   Patient has no known allergies.   Review of Systems Review of Systems  All systems reviewed and negative, other than as noted in HPI.   Physical Exam Updated Vital Signs BP 134/75   Pulse 94   Temp 97.5 F (36.4 C) (Oral)   Resp (!) 28   SpO2 92%   Physical Exam  Constitutional: She appears well-developed and  well-nourished. No distress.  HENT:  Head: Normocephalic and atraumatic.  Eyes: Conjunctivae are normal. Right eye exhibits no discharge. Left eye exhibits no discharge.  Neck: Neck supple.  Cardiovascular: Normal rate, regular rhythm and normal heart sounds.  Exam reveals no gallop and no friction rub.   No murmur heard. Pulmonary/Chest: Effort normal and breath sounds normal. No respiratory distress.  Abdominal: Soft. She exhibits no distension. There is no tenderness.  Musculoskeletal: She exhibits no edema or tenderness.  Neurological: She is alert.  Skin: Skin is warm and dry.  Psychiatric: She has a normal mood and affect. Her behavior is normal. Thought content normal.  Nursing note and vitals reviewed.    ED Treatments / Results  Labs (all labs ordered are listed, but only abnormal results are displayed) Labs Reviewed  CBC WITH DIFFERENTIAL/PLATELET - Abnormal; Notable for the following:       Result Value   Hemoglobin 16.3 (*)    HCT 46.2 (*)    All other components within normal limits  BASIC METABOLIC PANEL - Abnormal; Notable for the following:    Potassium 3.4 (*)    Glucose, Bld 142 (*)    Calcium 8.8 (*)    All other components within normal limits    EKG  EKG Interpretation  Date/Time:  Tuesday May 12 2016 09:18:06 EST Ventricular Rate:  89 PR Interval:    QRS Duration: 83 QT Interval:  397 QTC Calculation: 484 R Axis:   57 Text Interpretation:  Sinus rhythm Probable left atrial enlargement Borderline T wave abnormalities Baseline wander in lead(s) V5 V6 Confirmed by Juleen China  MD, Ivo Moga ([id number]) on 05/12/2016 9:27:36 AM       Radiology No results found.   Dg Chest 2 View  Result Date: 05/12/2016 CLINICAL DATA:  New worsening cough, congestion and shortness of breath. EXAM: CHEST  2 VIEW COMPARISON:  08/23/2012 FINDINGS: The heart size and mediastinal contours are within normal limits. Both lungs are clear. No pleural effusion or pneumothorax. The  visualized skeletal structures are unremarkable. IMPRESSION: No active cardiopulmonary disease. Electronically Signed   By: Amie Portland M.D.   On: 05/12/2016 09:51    Procedures Procedures (including critical care time)  Medications Ordered in ED Medications  magnesium sulfate IVPB 1 g 100 mL (0 g Intravenous Stopped 05/12/16 1056)  predniSONE (DELTASONE) tablet 40 mg (40 mg Oral Given 05/12/16 1311)  albuterol (PROVENTIL HFA;VENTOLIN HFA) 108 (90 Base) MCG/ACT inhaler 2 puff (2 puffs Inhalation Given 05/12/16 1312)     Initial Impression / Assessment and Plan / ED Course  I have reviewed the triage vital signs and the nursing notes.  Pertinent labs & imaging results that were available during my care of the patient were reviewed by me and considered in my medical decision making (see chart for details).  Clinical Course      Final Clinical Impressions(s) / ED Diagnoses   Final diagnoses:  Bronchitis    New Prescriptions Discharge Medication List as of 05/12/2016 12:50 PM    START taking these medications   Details  predniSONE (DELTASONE) 20 MG tablet Take 2 tablets (40 mg total) by mouth daily., Starting Tue 05/12/2016, Print         Laurie Razor, MD 05/19/16 [phone]

## 2018-09-04 ENCOUNTER — Emergency Department (HOSPITAL_COMMUNITY): Payer: Self-pay

## 2018-09-04 ENCOUNTER — Inpatient Hospital Stay (HOSPITAL_COMMUNITY): Payer: Self-pay

## 2018-09-04 ENCOUNTER — Encounter (HOSPITAL_COMMUNITY): Payer: Self-pay | Admitting: Emergency Medicine

## 2018-09-04 ENCOUNTER — Encounter (HOSPITAL_COMMUNITY)
Admission: EM | Disposition: A | Discharge status: Home / Self Care | Payer: Self-pay | Source: Home / Self Care | Attending: Interventional Cardiology

## 2018-09-04 ENCOUNTER — Inpatient Hospital Stay (HOSPITAL_COMMUNITY)
Admission: EM | Admit: 2018-09-04 | Discharge: 2018-09-06 | DRG: 247 | Disposition: A | Discharge status: Home / Self Care | Payer: Self-pay | Attending: Interventional Cardiology | Admitting: Interventional Cardiology

## 2018-09-04 DIAGNOSIS — F17210 Nicotine dependence, cigarettes, uncomplicated: Secondary | ICD-10-CM | POA: Diagnosis present

## 2018-09-04 DIAGNOSIS — I2109 ST elevation (STEMI) myocardial infarction involving other coronary artery of anterior wall: Principal | ICD-10-CM | POA: Diagnosis present

## 2018-09-04 DIAGNOSIS — I219 Acute myocardial infarction, unspecified: Secondary | ICD-10-CM | POA: Diagnosis present

## 2018-09-04 DIAGNOSIS — J449 Chronic obstructive pulmonary disease, unspecified: Secondary | ICD-10-CM | POA: Diagnosis present

## 2018-09-04 DIAGNOSIS — I2510 Atherosclerotic heart disease of native coronary artery without angina pectoris: Secondary | ICD-10-CM | POA: Diagnosis present

## 2018-09-04 DIAGNOSIS — E785 Hyperlipidemia, unspecified: Secondary | ICD-10-CM

## 2018-09-04 DIAGNOSIS — Z720 Tobacco use: Secondary | ICD-10-CM

## 2018-09-04 DIAGNOSIS — I2111 ST elevation (STEMI) myocardial infarction involving right coronary artery: Secondary | ICD-10-CM

## 2018-09-04 DIAGNOSIS — I472 Ventricular tachycardia: Secondary | ICD-10-CM | POA: Diagnosis not present

## 2018-09-04 DIAGNOSIS — R0602 Shortness of breath: Secondary | ICD-10-CM | POA: Diagnosis not present

## 2018-09-04 DIAGNOSIS — Z955 Presence of coronary angioplasty implant and graft: Secondary | ICD-10-CM

## 2018-09-04 DIAGNOSIS — Y909 Presence of alcohol in blood, level not specified: Secondary | ICD-10-CM | POA: Diagnosis present

## 2018-09-04 DIAGNOSIS — I249 Acute ischemic heart disease, unspecified: Secondary | ICD-10-CM | POA: Diagnosis present

## 2018-09-04 DIAGNOSIS — R9431 Abnormal electrocardiogram [ECG] [EKG]: Secondary | ICD-10-CM

## 2018-09-04 DIAGNOSIS — T50995A Adverse effect of other drugs, medicaments and biological substances, initial encounter: Secondary | ICD-10-CM | POA: Diagnosis not present

## 2018-09-04 DIAGNOSIS — I213 ST elevation (STEMI) myocardial infarction of unspecified site: Secondary | ICD-10-CM

## 2018-09-04 DIAGNOSIS — F17200 Nicotine dependence, unspecified, uncomplicated: Secondary | ICD-10-CM | POA: Diagnosis present

## 2018-09-04 DIAGNOSIS — Y92239 Unspecified place in hospital as the place of occurrence of the external cause: Secondary | ICD-10-CM | POA: Diagnosis not present

## 2018-09-04 DIAGNOSIS — Z7952 Long term (current) use of systemic steroids: Secondary | ICD-10-CM

## 2018-09-04 DIAGNOSIS — F10129 Alcohol abuse with intoxication, unspecified: Secondary | ICD-10-CM | POA: Diagnosis present

## 2018-09-04 DIAGNOSIS — Z79899 Other long term (current) drug therapy: Secondary | ICD-10-CM

## 2018-09-04 HISTORY — PX: CORONARY/GRAFT ACUTE MI REVASCULARIZATION: CATH118305

## 2018-09-04 HISTORY — DX: Tobacco use: Z72.0

## 2018-09-04 HISTORY — PX: CORONARY STENT INTERVENTION: CATH118234

## 2018-09-04 HISTORY — PX: LEFT HEART CATH AND CORONARY ANGIOGRAPHY: CATH118249

## 2018-09-04 HISTORY — DX: Hyperlipidemia, unspecified: E78.5

## 2018-09-04 HISTORY — DX: ST elevation (STEMI) myocardial infarction of unspecified site: I21.3

## 2018-09-04 LAB — COMPREHENSIVE METABOLIC PANEL
ALT: 18 U/L (ref 0–44)
ALT: 20 U/L (ref 0–44)
AST: 23 U/L (ref 15–41)
AST: 37 U/L (ref 15–41)
Albumin: 3.8 g/dL (ref 3.5–5.0)
Albumin: 3.6 g/dL (ref 3.5–5.0)
Alkaline Phosphatase: 94 U/L (ref 38–126)
Alkaline Phosphatase: 93 U/L (ref 38–126)
Anion gap: 17 — ABNORMAL HIGH (ref 5–15)
Anion gap: 15 (ref 5–15)
BUN: 12 mg/dL (ref 6–20)
BUN: 10 mg/dL (ref 6–20)
CO2: 18 mmol/L — ABNORMAL LOW (ref 22–32)
CO2: 20 mmol/L — ABNORMAL LOW (ref 22–32)
Calcium: 8.9 mg/dL (ref 8.9–10.3)
Calcium: 8.9 mg/dL (ref 8.9–10.3)
Chloride: 107 mmol/L (ref 98–111)
Chloride: 108 mmol/L (ref 98–111)
Creatinine, Ser: 1.06 mg/dL — ABNORMAL HIGH (ref 0.44–1.00)
Creatinine, Ser: 0.73 mg/dL (ref 0.44–1.00)
GFR calc Af Amer: >60 mL/min (ref >60)
GFR calc Af Amer: >60 mL/min (ref >60)
GFR calc non Af Amer: 58 mL/min — ABNORMAL LOW (ref >60)
GFR calc non Af Amer: >60 mL/min (ref >60)
Glucose, Bld: 139 mg/dL — ABNORMAL HIGH (ref 70–99)
Glucose, Bld: 114 mg/dL — ABNORMAL HIGH (ref 70–99)
Potassium: 3.3 mmol/L — ABNORMAL LOW (ref 3.5–5.1)
Potassium: 3.6 mmol/L (ref 3.5–5.1)
Sodium: 142 mmol/L (ref 135–145)
Sodium: 143 mmol/L (ref 135–145)
Total Bilirubin: 0.5 mg/dL (ref 0.3–1.2)
Total Bilirubin: 0.3 mg/dL (ref 0.3–1.2)
Total Protein: 7.2 g/dL (ref 6.5–8.1)
Total Protein: 6.9 g/dL (ref 6.5–8.1)

## 2018-09-04 LAB — CBC WITH DIFFERENTIAL/PLATELET
Abs Immature Granulocytes: 0.02 10*3/uL (ref 0.00–0.07)
Basophils Absolute: 0 10*3/uL (ref 0.0–0.1)
Basophils Relative: 1 %
Eosinophils Absolute: 0.1 10*3/uL (ref 0.0–0.5)
Eosinophils Relative: 1 %
HCT: 48.5 % — ABNORMAL HIGH (ref 36.0–46.0)
Hemoglobin: 16.4 g/dL — ABNORMAL HIGH (ref 12.0–15.0)
Immature Granulocytes: 0 %
Lymphocytes Relative: 35 %
Lymphs Abs: 2.7 10*3/uL (ref 0.7–4.0)
MCH: 32.1 pg (ref 26.0–34.0)
MCHC: 33.8 g/dL (ref 30.0–36.0)
MCV: 94.9 fL (ref 80.0–100.0)
Monocytes Absolute: 0.8 10*3/uL (ref 0.1–1.0)
Monocytes Relative: 10 %
Neutro Abs: 4.1 10*3/uL (ref 1.7–7.7)
Neutrophils Relative %: 53 %
Platelets: 297 10*3/uL (ref 150–400)
RBC: 5.11 MIL/uL (ref 3.87–5.11)
RDW: 13.6 % (ref 11.5–15.5)
WBC: 7.8 10*3/uL (ref 4.0–10.5)
nRBC: 0 % (ref 0.0–0.2)

## 2018-09-04 LAB — PROTIME-INR
INR: 1 (ref 0.8–1.2)
Prothrombin Time: 13.2 seconds (ref 11.4–15.2)

## 2018-09-04 LAB — LIPID PANEL
Cholesterol: 181 mg/dL (ref 0–200)
HDL: 54 mg/dL (ref >40)
LDL Cholesterol: 89 mg/dL (ref 0–99)
Total CHOL/HDL Ratio: 3.4 RATIO
Triglycerides: 192 mg/dL — ABNORMAL HIGH (ref <150)
VLDL: 38 mg/dL (ref 0–40)

## 2018-09-04 LAB — TSH: TSH: 0.649 u[IU]/mL (ref 0.350–4.500)

## 2018-09-04 LAB — I-STAT BETA HCG BLOOD, ED (MC, WL, AP ONLY): I-stat hCG, quantitative: <5 m[IU]/mL (ref <5)

## 2018-09-04 LAB — ECHOCARDIOGRAM LIMITED
Height: 61 in
Weight: 2246.93 oz

## 2018-09-04 LAB — HIV ANTIBODY (ROUTINE TESTING W REFLEX): HIV Screen 4th Generation wRfx: NONREACTIVE

## 2018-09-04 LAB — TROPONIN I
Troponin I: <0.03 ng/mL (ref <0.03)
Troponin I: 6.41 ng/mL (ref <0.03)
Troponin I: 3.63 ng/mL (ref <0.03)

## 2018-09-04 LAB — APTT: aPTT: 31 seconds (ref 24–36)

## 2018-09-04 LAB — CBC
HCT: 46.5 % — ABNORMAL HIGH (ref 36.0–46.0)
Hemoglobin: 15.9 g/dL — ABNORMAL HIGH (ref 12.0–15.0)
MCH: 31.7 pg (ref 26.0–34.0)
MCHC: 34.2 g/dL (ref 30.0–36.0)
MCV: 92.6 fL (ref 80.0–100.0)
Platelets: 274 10*3/uL (ref 150–400)
RBC: 5.02 MIL/uL (ref 3.87–5.11)
RDW: 13.7 % (ref 11.5–15.5)
WBC: 10.5 10*3/uL (ref 4.0–10.5)
nRBC: 0 % (ref 0.0–0.2)

## 2018-09-04 LAB — HEMOGLOBIN A1C
Hgb A1c MFr Bld: 5.9 % — ABNORMAL HIGH (ref 4.8–5.6)
Mean Plasma Glucose: 122.63 mg/dL

## 2018-09-04 LAB — T4, FREE: Free T4: 0.86 ng/dL (ref 0.82–1.77)

## 2018-09-04 LAB — MRSA PCR SCREENING: MRSA by PCR: NEGATIVE

## 2018-09-04 LAB — ETHANOL: Alcohol, Ethyl (B): 116 mg/dL — ABNORMAL HIGH (ref <10)

## 2018-09-04 LAB — BRAIN NATRIURETIC PEPTIDE: B Natriuretic Peptide: 20.3 pg/mL (ref 0.0–100.0)

## 2018-09-04 SURGERY — CORONARY/GRAFT ACUTE MI REVASCULARIZATION
Anesthesia: LOCAL

## 2018-09-04 MED ORDER — HEPARIN (PORCINE) IN NACL 1000-0.9 UT/500ML-% IV SOLN
INTRAVENOUS | Status: DC | PRN
  Administered 2018-09-04 (×3): 500 mL

## 2018-09-04 MED ORDER — HEPARIN SODIUM (PORCINE) 5000 UNIT/ML IJ SOLN
60.0000 [IU]/kg | Freq: Once | INTRAMUSCULAR | Status: AC
  Administered 2018-09-04: 02:00:00 5000 [IU] via INTRAVENOUS
  Filled 2018-09-04: qty 1

## 2018-09-04 MED ORDER — SODIUM CHLORIDE 0.9% FLUSH: 3.0000 mL | INTRAVENOUS | Status: DC | PRN

## 2018-09-04 MED ORDER — CHLORHEXIDINE GLUCONATE CLOTH 2 % EX PADS
6.0000 | MEDICATED_PAD | Freq: Every day | CUTANEOUS | Status: DC
  Administered 2018-09-04 – 2018-09-05 (×2): 6 via TOPICAL

## 2018-09-04 MED ORDER — TICAGRELOR 90 MG PO TABS
90.0000 mg | ORAL_TABLET | Freq: Two times a day (BID) | ORAL | Status: DC
  Administered 2018-09-04 (×2): 90 mg via ORAL
  Filled 2018-09-04 (×2): qty 1

## 2018-09-04 MED ORDER — HEPARIN SODIUM (PORCINE) 5000 UNIT/ML IJ SOLN
5000.0000 [IU] | Freq: Three times a day (TID) | INTRAMUSCULAR | Status: DC
  Administered 2018-09-04 – 2018-09-06 (×5): 5000 [IU] via SUBCUTANEOUS
  Filled 2018-09-04 (×5): qty 1

## 2018-09-04 MED ORDER — SODIUM CHLORIDE 0.9 % IV SOLN
INTRAVENOUS | Status: AC | PRN
  Administered 2018-09-04: 50 mL/h via INTRAVENOUS

## 2018-09-04 MED ORDER — TIROFIBAN HCL IN NACL 5-0.9 MG/100ML-% IV SOLN
INTRAVENOUS | Status: AC
  Filled 2018-09-04: qty 100

## 2018-09-04 MED ORDER — IOHEXOL 350 MG/ML SOLN
INTRAVENOUS | Status: DC | PRN
  Administered 2018-09-04: 03:00:00 145 mL via INTRA_ARTERIAL

## 2018-09-04 MED ORDER — HEPARIN SODIUM (PORCINE) 1000 UNIT/ML IJ SOLN
INTRAMUSCULAR | Status: DC | PRN
  Administered 2018-09-04: 9000 [IU] via INTRAVENOUS

## 2018-09-04 MED ORDER — TICAGRELOR 90 MG PO TABS
ORAL_TABLET | ORAL | Status: AC
  Filled 2018-09-04: qty 2

## 2018-09-04 MED ORDER — ONDANSETRON HCL 4 MG/2ML IJ SOLN
4.0000 mg | Freq: Four times a day (QID) | INTRAMUSCULAR | Status: DC | PRN
  Administered 2018-09-04: 4 mg via INTRAVENOUS
  Filled 2018-09-04: qty 2

## 2018-09-04 MED ORDER — METOPROLOL TARTRATE 12.5 MG HALF TABLET
12.5000 mg | ORAL_TABLET | Freq: Two times a day (BID) | ORAL | Status: DC
  Administered 2018-09-04 (×2): 12.5 mg via ORAL
  Filled 2018-09-04 (×2): qty 1

## 2018-09-04 MED ORDER — POTASSIUM CHLORIDE 20 MEQ/15ML (10%) PO SOLN
ORAL | Status: DC | PRN
  Administered 2018-09-04: 40 meq via ORAL

## 2018-09-04 MED ORDER — ASPIRIN EC 81 MG PO TBEC: 81.0000 mg | DELAYED_RELEASE_TABLET | Freq: Every day | ORAL | Status: DC

## 2018-09-04 MED ORDER — TICAGRELOR 90 MG PO TABS
ORAL_TABLET | ORAL | Status: DC | PRN
  Administered 2018-09-04: 180 mg via ORAL

## 2018-09-04 MED ORDER — ACETAMINOPHEN 325 MG PO TABS: 650.0000 mg | ORAL_TABLET | ORAL | Status: DC | PRN

## 2018-09-04 MED ORDER — SODIUM CHLORIDE 0.9 % IV SOLN
INTRAVENOUS | Status: DC
  Administered 2018-09-04: 02:00:00 via INTRAVENOUS

## 2018-09-04 MED ORDER — LORAZEPAM 2 MG/ML IJ SOLN
1.0000 mg | INTRAMUSCULAR | Status: DC | PRN
  Administered 2018-09-04: 1 mg via INTRAVENOUS
  Filled 2018-09-04: qty 1

## 2018-09-04 MED ORDER — LABETALOL HCL 5 MG/ML IV SOLN
10.0000 mg | INTRAVENOUS | Status: DC | PRN
  Administered 2018-09-04 (×2): 10 mg via INTRAVENOUS
  Filled 2018-09-04: qty 4

## 2018-09-04 MED ORDER — ASPIRIN 81 MG PO CHEW
81.0000 mg | CHEWABLE_TABLET | Freq: Every day | ORAL | Status: DC
  Administered 2018-09-04 – 2018-09-06 (×3): 81 mg via ORAL
  Filled 2018-09-04 (×3): qty 1

## 2018-09-04 MED ORDER — TIROFIBAN (AGGRASTAT) BOLUS VIA INFUSION
INTRAVENOUS | Status: DC | PRN
  Administered 2018-09-04: 1360 ug via INTRAVENOUS

## 2018-09-04 MED ORDER — ATROPINE SULFATE 1 MG/10ML IJ SOSY
PREFILLED_SYRINGE | INTRAMUSCULAR | Status: DC | PRN
  Administered 2018-09-04: 1 mg via INTRAVENOUS

## 2018-09-04 MED ORDER — VERAPAMIL HCL 2.5 MG/ML IV SOLN
INTRAVENOUS | Status: DC | PRN
  Administered 2018-09-04: 02:00:00 10 mL via INTRA_ARTERIAL

## 2018-09-04 MED ORDER — FENTANYL CITRATE (PF) 100 MCG/2ML IJ SOLN
INTRAMUSCULAR | Status: AC
  Filled 2018-09-04: qty 2

## 2018-09-04 MED ORDER — ATORVASTATIN CALCIUM 80 MG PO TABS
80.0000 mg | ORAL_TABLET | Freq: Every day | ORAL | Status: DC
  Administered 2018-09-04 – 2018-09-05 (×2): 80 mg via ORAL
  Filled 2018-09-04 (×2): qty 1

## 2018-09-04 MED ORDER — SODIUM CHLORIDE 0.9 % IV SOLN: 250.0000 mL | INTRAVENOUS | Status: DC | PRN

## 2018-09-04 MED ORDER — NITROGLYCERIN 0.4 MG SL SUBL: 0.4000 mg | SUBLINGUAL_TABLET | SUBLINGUAL | Status: DC | PRN

## 2018-09-04 MED ORDER — VERAPAMIL HCL 2.5 MG/ML IV SOLN
INTRAVENOUS | Status: AC
  Filled 2018-09-04: qty 2

## 2018-09-04 MED ORDER — HYDRALAZINE HCL 20 MG/ML IJ SOLN
10.0000 mg | INTRAMUSCULAR | Status: DC | PRN
  Administered 2018-09-04: 23:00:00 10 mg via INTRAVENOUS
  Filled 2018-09-04: qty 1

## 2018-09-04 MED ORDER — POTASSIUM CHLORIDE 20 MEQ/15ML (10%) PO SOLN
ORAL | Status: AC
  Filled 2018-09-04: qty 30

## 2018-09-04 MED ORDER — NITROGLYCERIN 0.4 MG SL SUBL
0.4000 mg | SUBLINGUAL_TABLET | Freq: Once | SUBLINGUAL | Status: AC
  Administered 2018-09-04: 0.4 mg via SUBLINGUAL

## 2018-09-04 MED ORDER — HEPARIN SODIUM (PORCINE) 5000 UNIT/ML IJ SOLN
INTRAMUSCULAR | Status: AC
  Filled 2018-09-04: qty 1

## 2018-09-04 MED ORDER — ONDANSETRON HCL 4 MG/2ML IJ SOLN: 4.0000 mg | Freq: Four times a day (QID) | INTRAMUSCULAR | Status: DC | PRN

## 2018-09-04 MED ORDER — ATROPINE SULFATE 1 MG/10ML IJ SOSY
PREFILLED_SYRINGE | INTRAMUSCULAR | Status: AC
  Filled 2018-09-04: qty 10

## 2018-09-04 MED ORDER — HEPARIN SODIUM (PORCINE) 1000 UNIT/ML IJ SOLN
INTRAMUSCULAR | Status: AC
  Filled 2018-09-04: qty 1

## 2018-09-04 MED ORDER — ACETAMINOPHEN 325 MG PO TABS
650.0000 mg | ORAL_TABLET | ORAL | Status: DC | PRN
  Administered 2018-09-04 (×3): 650 mg via ORAL
  Filled 2018-09-04 (×3): qty 2

## 2018-09-04 MED ORDER — POTASSIUM CHLORIDE CRYS ER 20 MEQ PO TBCR: 40.0000 meq | EXTENDED_RELEASE_TABLET | Freq: Once | ORAL | Status: AC

## 2018-09-04 MED ORDER — TIROFIBAN HCL IV 12.5 MG/250 ML
0.0750 ug/kg/min | INTRAVENOUS | Status: AC
  Administered 2018-09-04: 04:00:00 0.075 ug/kg/min via INTRAVENOUS
  Filled 2018-09-04: qty 250

## 2018-09-04 MED ORDER — TIROFIBAN HCL IN NACL 5-0.9 MG/100ML-% IV SOLN
INTRAVENOUS | Status: AC | PRN
  Administered 2018-09-04: 0.075 ug/kg/min via INTRAVENOUS

## 2018-09-04 MED ORDER — NITROGLYCERIN 0.4 MG SL SUBL
SUBLINGUAL_TABLET | SUBLINGUAL | Status: AC
  Administered 2018-09-04: 02:00:00 0.4 mg via SUBLINGUAL
  Filled 2018-09-04: qty 1

## 2018-09-04 MED ORDER — LIDOCAINE HCL (PF) 1 % IJ SOLN
INTRAMUSCULAR | Status: AC
  Filled 2018-09-04: qty 30

## 2018-09-04 MED ORDER — SODIUM CHLORIDE 0.9 % IV SOLN
INTRAVENOUS | Status: AC
  Administered 2018-09-04: 06:00:00 via INTRAVENOUS

## 2018-09-04 MED ORDER — SODIUM CHLORIDE 0.9% FLUSH
3.0000 mL | Freq: Two times a day (BID) | INTRAVENOUS | Status: DC
  Administered 2018-09-04 – 2018-09-06 (×5): 3 mL via INTRAVENOUS

## 2018-09-04 MED ORDER — HEPARIN (PORCINE) IN NACL 1000-0.9 UT/500ML-% IV SOLN
INTRAVENOUS | Status: AC
  Filled 2018-09-04: qty 1000

## 2018-09-04 MED ORDER — MORPHINE SULFATE (PF) 4 MG/ML IV SOLN
INTRAVENOUS | Status: AC
  Filled 2018-09-04: qty 1

## 2018-09-04 MED ORDER — FENTANYL CITRATE (PF) 100 MCG/2ML IJ SOLN
INTRAMUSCULAR | Status: DC | PRN
  Administered 2018-09-04: 25 ug via INTRAVENOUS

## 2018-09-04 MED ORDER — LIDOCAINE HCL (PF) 1 % IJ SOLN
INTRAMUSCULAR | Status: DC | PRN
  Administered 2018-09-04: 2 mL

## 2018-09-04 SURGICAL SUPPLY — 17 items
BALLN EMERGE MR 2.5X15 (BALLOONS) ×2
BALLN SAPPHIRE ~~LOC~~ 3.25X15 (BALLOONS) ×2 IMPLANT
BALLOON EMERGE MR 2.5X15 (BALLOONS) ×1 IMPLANT
CATH INFINITI 5 FR JL3.5 (CATHETERS) ×2 IMPLANT
CATH LAUNCHER 6FR JR4 (CATHETERS) ×2 IMPLANT
DEVICE RAD COMP TR BAND LRG (VASCULAR PRODUCTS) ×2 IMPLANT
GLIDESHEATH SLEND A-KIT 6F 22G (SHEATH) ×2 IMPLANT
GUIDEWIRE INQWIRE 1.5J.035X260 (WIRE) ×1 IMPLANT
INQWIRE 1.5J .035X260CM (WIRE) ×2
KIT ENCORE 26 ADVANTAGE (KITS) ×2 IMPLANT
KIT HEART LEFT (KITS) ×2 IMPLANT
PACK CARDIAC CATHETERIZATION (CUSTOM PROCEDURE TRAY) ×2 IMPLANT
SHEATH PROBE COVER 6X72 (BAG) ×2 IMPLANT
STENT RESOLUTE ONYX 3.0X22 (Permanent Stent) ×2 IMPLANT
TRANSDUCER W/STOPCOCK (MISCELLANEOUS) ×2 IMPLANT
TUBING CIL FLEX 10 FLL-RA (TUBING) ×2 IMPLANT
WIRE ASAHI PROWATER 180CM (WIRE) ×2 IMPLANT
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
## 2018-09-04 NOTE — Progress Notes (Signed)
Note:  Husband Bud Face called chaplain back at [phone] to inquire about patient, saying no one had called him.  Chaplain indicated that his message had been conveyed earlier both verbally (to [place]) and via chart following our previous call at 0209. Chaplain checked with RN Gerilyn Nestle to confirm this. RN stated that patient did not desire to receive a call from the husband or have the staff speak to husband.    Chaplain called husband back to inform him that his message had been conveyed, but that patient did not want a call, and that she had been resting. Per discussion with RN, Chaplain provided minimal information. Did not promise a call back, reminding husband that patients control own patient information, but that husband's morning call back would be shared with RN.    Belia Heman, [place] [phone]

## 2018-09-04 NOTE — Progress Notes (Signed)
ANTICOAGULATION CONSULT NOTE - Initial Consult  Pharmacy Consult for tirofiban Indication: chest pain/ACS  No Known Allergies  Patient Measurements: Height: 5\' 1"  (154.9 cm) Weight: 120 lb (54.4 kg) IBW/kg (Calculated) : 47.8  Vital Signs: Temp: 97.7 F (36.5 C) (04/26 0126) Temp Source: Oral (04/26 0126) BP: 118/93 (04/26 0145) Pulse Rate: 85 (04/26 0145)  Labs: Recent Labs    09/04/18 0136  HGB 16.4*  HCT 48.5*  PLT 297  APTT 31  LABPROT 13.2  INR 1.0  CREATININE 1.06*  TROPONINI <0.03    Estimated Creatinine Clearance: 43.7 mL/min (A) (by C-G formula based on SCr of 1.06 mg/dL (H)).   Medical History: Past Medical History:  Diagnosis Date  . COPD (chronic obstructive pulmonary disease) (HCC)     Medications:  Scheduled:  . aspirin  81 mg Oral Daily  . atorvastatin  80 mg Oral q1800  . Chlorhexidine Gluconate Cloth  6 each Topical Daily  . heparin  5,000 Units Subcutaneous Q8H  . metoprolol tartrate  12.5 mg Oral BID  . morphine      . potassium chloride  40 mEq Oral Once  . sodium chloride flush  3 mL Intravenous Q12H  . ticagrelor  90 mg Oral BID   Infusions:  . sodium chloride 20 mL/hr at 09/04/18 0144  . sodium chloride    . sodium chloride    . tirofiban      Assessment: 58 yo F admitted with STEMI. S/p PCI with DES to mid RCA. Pharmacy consulted for tirofiban dosing for 4 hours post procedure.  Patient received tirofiban bolus (1,360 mcg) at 0213 and was started on tirofiban IV 0.075 mcg/kg/min at 0230 in the cath lab. Cath procedure end time noted at 0309.  SCr 1.06; CrCl < 60  Goal of Therapy:  Monitor platelets by anticoagulation protocol: Yes   Plan:  Tirofiban IV 0.075 mcg/kg/min x 4 hours post PCI (end 4/26 @ 0700) Monitor for bleeding  Danae Orleans, PharmD PGY1 Pharmacy Resident Phone [phone] 09/04/2018       4:00 AM

## 2018-09-04 NOTE — ED Notes (Signed)
4mg  morphine IV given per verbal order EDP.

## 2018-09-04 NOTE — Progress Notes (Signed)
Spoke w/ED staff in regards to pt's belongings.   Pt sts she is missing 4 rings, pandora bracelet and necklace.   Per ED staff, it is in the security office... Notified pt that we will send for it.... pt verb understanding.

## 2018-09-04 NOTE — Progress Notes (Signed)
Paged Cards fellow... Spoke w/Dr. Wallie Renshaw and notified him that pt sts she is having difficulty breathing and feeling nauseas. ECG done w/no changes noted... O2 stat > 95%... Unable to sleep and anxious.... sts she needs her rescue inhalers but there is nothing ordered  Pt denies chest pain.   Pt had Brilinta at 2133 ... Per Dr. Deforest Hoyles, it is more likely the Brilinta and to give her 1 mg of Ativan Q4h PRN  Will continue to monitor pt closely
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
## 2018-09-04 NOTE — Progress Notes (Signed)
Patient ID: Laurie Espinoza, female   DOB: 05/19/1960, 57 y.o.   MRN: [id number]  Patient stable this morning s/p PCI to RCA for inferior MI.  Continue post-MI care, echo today.   Marca Ancona 09/04/2018 8:08 AM

## 2018-09-04 NOTE — CV Procedure (Signed)
   Acute inferior ST elevation myocardial infarction onset less than 60 minutes prior to arrival.  Coronary angiography, left ventriculography, RCA stent via right radial approach using real-time vascular ultrasound for vascular access.  3.0 x 22 Onyx postdilated to 3.25 mm in diameter reducing total occlusion in the mid vessel with TIMI grade 0 flow to 0% with TIMI grade III flow.  Left main is normal.  LAD contains minimal proximal and mid luminal irregularities up to 60% in large first diagonal contains luminal irregularities, circumflex contains 50% proximal narrowing and 60% narrowing after the origin of the first obtuse marginal.  Inferior/basal hypokinesis.  EF 70%.  LVEDP less than 10.

## 2018-09-04 NOTE — Progress Notes (Signed)
  Echocardiogram 2D Echocardiogram has been performed.  Laurie Espinoza 09/04/2018, 11:18 AM

## 2018-09-04 NOTE — Progress Notes (Signed)
CRITICAL VALUE ALERT  Critical Value:  6.41, Troponin  Date & Time Notied:  09/04/2018. 1410ht  Provider Notified: Rudene Anda  Orders Received/Actions taken: None
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
## 2018-09-04 NOTE — Progress Notes (Signed)
Pt bradycardic down to 40s-50s and diaphoretic. Complained of SOB. EKG performed and compared to previous. Dr. Rudene Anda paged.  Joycie Peek, RN 09/04/2018. [phone]

## 2018-09-05 ENCOUNTER — Encounter (HOSPITAL_COMMUNITY): Payer: Self-pay | Admitting: Interventional Cardiology

## 2018-09-05 LAB — RENAL FUNCTION PANEL
Albumin: 3.4 g/dL — ABNORMAL LOW (ref 3.5–5.0)
Anion gap: 9 (ref 5–15)
BUN: 7 mg/dL (ref 6–20)
CO2: 23 mmol/L (ref 22–32)
Calcium: 9.4 mg/dL (ref 8.9–10.3)
Chloride: 107 mmol/L (ref 98–111)
Creatinine, Ser: 0.75 mg/dL (ref 0.44–1.00)
GFR calc Af Amer: >60 mL/min (ref >60)
GFR calc non Af Amer: >60 mL/min (ref >60)
Glucose, Bld: 113 mg/dL — ABNORMAL HIGH (ref 70–99)
Phosphorus: 3.3 mg/dL (ref 2.5–4.6)
Potassium: 3.6 mmol/L (ref 3.5–5.1)
Sodium: 139 mmol/L (ref 135–145)

## 2018-09-05 LAB — POCT ACTIVATED CLOTTING TIME
Activated Clotting Time: 549 seconds
Activated Clotting Time: 510 seconds

## 2018-09-05 LAB — TROPONIN I: Troponin I: 2.95 ng/mL (ref <0.03)

## 2018-09-05 LAB — POCT I-STAT CREATININE: Creatinine, Ser: 0.6 mg/dL (ref 0.44–1.00)

## 2018-09-05 LAB — MAGNESIUM: Magnesium: 2 mg/dL (ref 1.7–2.4)

## 2018-09-05 MED ORDER — CLOPIDOGREL BISULFATE 75 MG PO TABS
75.0000 mg | ORAL_TABLET | Freq: Every day | ORAL | Status: DC
  Administered 2018-09-06: 09:00:00 75 mg via ORAL
  Filled 2018-09-05: qty 1

## 2018-09-05 MED ORDER — POTASSIUM CHLORIDE CRYS ER 20 MEQ PO TBCR
40.0000 meq | EXTENDED_RELEASE_TABLET | Freq: Once | ORAL | Status: AC
  Administered 2018-09-05: 10:00:00 40 meq via ORAL
  Filled 2018-09-05: qty 2

## 2018-09-05 MED ORDER — METOPROLOL TARTRATE 25 MG PO TABS
25.0000 mg | ORAL_TABLET | Freq: Two times a day (BID) | ORAL | Status: DC
  Administered 2018-09-05 – 2018-09-06 (×3): 25 mg via ORAL
  Filled 2018-09-05 (×3): qty 1

## 2018-09-05 MED ORDER — CLOPIDOGREL BISULFATE 300 MG PO TABS
300.0000 mg | ORAL_TABLET | Freq: Once | ORAL | Status: AC
  Administered 2018-09-05: 10:00:00 300 mg via ORAL
  Filled 2018-09-05: qty 1

## 2018-09-05 NOTE — Progress Notes (Signed)
Cardiac Rehab Advisory Cardiac Rehab Phase I is not seeing pts face to face at this time due to Covid 19 restrictions. Ambulation is occurring through nursing, PT, and mobility teams. We will help facilitate that process as needed. We are calling pts in their rooms and discussing education. We will then deliver education materials to pts RN for delivery to pt.   [phone] Pt educated per phone and voiced understanding. Reviewed MI restrictions, NTG use, importance of plavix with stent, smoking cessation, walking for ex, heart healthy food choices and watching carbs, and CRP 2.  Pt stated she has quit smoking in past cold [place] and remained not smoking for about 6 months. Encouraged pt to quit now and will give smoking cessation handout. Discussed walking for ex and guidelines given. 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 Discussed CRP 2 and referred to [place]. Encouraged pt to walk with RN today. Pt would like to see case manager. Will notify RN when written materials given .

## 2018-09-05 NOTE — Progress Notes (Signed)
Pt is resting/sleeping well at the moment... O2 = 99%, HR = 81, BP = 159/84, RR = 20   Will continue to monitor pt closely

## 2018-09-05 NOTE — TOC Initial Note (Signed)
Transition of Care [facility]) - Initial/Assessment Note    Patient Details  Name: Laurie Espinoza MRN: [id number] Date of Birth: 04/16/1961  Transition of Care [facility]) CM/SW Contact:    Gala Lewandowsky, RN Phone Number: 09/05/2018, 2:27 PM  Clinical Narrative: Pt presented for [facility]- s/p cath with stent to RCA. PTA from home alone, pt is without a job and has no insurance. CM did speak with patient and she is agreeable to CM scheduling a hospital follow-up appointment to the [facility]- pt states she can take the bus to appointments. CM did make patient aware that a pharmacy will be onsite and medications range from $4.00-$10.00. CM will ask MD to send medications to the TOC-Pharmacy so patient can get medications prior to transition home.                    Expected Discharge Plan: Home/Self Care Barriers to Discharge: Continued Medical Work up   Patient Goals and CMS Choice   CMS Medicare.gov Compare Post Acute Care list provided to:: (S) (N/A) Choice offered to / list presented to : NA  Expected Discharge Plan and Services Expected Discharge Plan: Home/Self Care In-house Referral: Financial Counselor Discharge Planning Services: CM Consult, Follow-up appt scheduled, Indigent Health Clinic, Medication Assistance Post Acute Care Choice: NA Living arrangements for the past 2 months: Single Family Home                 DME Arranged: N/A DME Agency: NA       HH Arranged: NA HH Agency: NA        Prior Living Arrangements/Services Living arrangements for the past 2 months: Single Family Home Lives with:: Self Patient language and need for interpreter reviewed:: Yes Do you feel safe going back to the place where you live?: Yes      Need for Family Participation in Patient Care: No (Comment) Care giver support system in place?: No (comment) Current home services: (N/A) Criminal Activity/Legal Involvement Pertinent to Current Situation/Hospitalization: No - Comment as  needed  Activities of Daily Living      Permission Sought/Granted Permission sought to share information with : Case Manager Permission granted to share information with : No              Emotional Assessment Appearance:: Appears stated age Attitude/Demeanor/Rapport: Engaged Affect (typically observed): Accepting Orientation: : Oriented to Self, Oriented to Situation, Oriented to Place, Oriented to  Time Alcohol / Substance Use: Not Applicable Psych Involvement: No (comment)  Admission diagnosis:  ACS (acute coronary syndrome) [facility]) [I24.9] Patient Active Problem List   Diagnosis Date Noted  . ACS (acute coronary syndrome) (HCC) 09/04/2018  . Acute myocardial infarction (HCC) 09/04/2018  . ST elevation myocardial infarction (STEMI) [facility])    PCP:  Patient, No Pcp Per Pharmacy:   [facility] [address] [place], [place] - [address] [place] [address] Phone: [phone] Fax: [phone]  [facility] [address] [place] - [address] [place] [address] Phone: [phone] Fax: [phone]     Social Determinants of Health (SDOH) Interventions    Readmission Risk Interventions No flowsheet data found.
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
## 2018-09-05 NOTE — Plan of Care (Signed)

## 2018-09-05 NOTE — Plan of Care (Signed)
  Problem: Education: Goal: Knowledge of General Education information will improve Description Including pain rating scale, medication(s)/side effects and non-pharmacologic comfort measures Outcome: Progressing   Problem: Health Behavior/Discharge Planning: Goal: Ability to manage health-related needs will improve Outcome: Progressing   Problem: Clinical Measurements: Goal: Ability to maintain clinical measurements within normal limits will improve Outcome: Progressing Goal: Will remain free from infection Outcome: Progressing Goal: Diagnostic test results will improve Outcome: Progressing Goal: Cardiovascular complication will be avoided Outcome: Progressing   Problem: Clinical Measurements: Goal: Respiratory complications will improve Outcome: Completed/Met   Problem: Activity: Goal: Risk for activity intolerance will decrease Outcome: Completed/Met   Problem: Nutrition: Goal: Adequate nutrition will be maintained Outcome: Completed/Met

## 2018-09-06 ENCOUNTER — Encounter (HOSPITAL_COMMUNITY): Payer: Self-pay | Admitting: Cardiology

## 2018-09-06 ENCOUNTER — Telehealth: Payer: Self-pay | Admitting: Cardiology

## 2018-09-06 DIAGNOSIS — E785 Hyperlipidemia, unspecified: Secondary | ICD-10-CM

## 2018-09-06 DIAGNOSIS — Z720 Tobacco use: Secondary | ICD-10-CM

## 2018-09-06 MED ORDER — NITROGLYCERIN 0.4 MG SL SUBL: 0.4000 mg | SUBLINGUAL_TABLET | SUBLINGUAL | 2 refills | Status: DC | PRN

## 2018-09-06 MED ORDER — ATORVASTATIN CALCIUM 80 MG PO TABS: 80.0000 mg | ORAL_TABLET | Freq: Every day | ORAL | 6 refills | Status: DC

## 2018-09-06 MED ORDER — ASPIRIN 81 MG PO CHEW: 81.0000 mg | CHEWABLE_TABLET | Freq: Every day | ORAL | 6 refills | Status: AC

## 2018-09-06 MED ORDER — METOPROLOL TARTRATE 25 MG PO TABS: 25.0000 mg | ORAL_TABLET | Freq: Two times a day (BID) | ORAL | 6 refills | Status: DC

## 2018-09-06 MED ORDER — CLOPIDOGREL BISULFATE 75 MG PO TABS: 75.0000 mg | ORAL_TABLET | Freq: Every day | ORAL | 2 refills | Status: DC

## 2018-09-06 MED FILL — CLOPIDOGREL 75 MG TABLET: 75 | 30 days supply | Qty: 30 | Fill #0 | Status: TO

## 2018-09-06 MED FILL — METOPROLOL TARTRATE 25 MG T: 25 | 30 days supply | Qty: 60 | Fill #0 | Status: TO

## 2018-09-06 MED FILL — NITROGLYCERIN 0.4 MG TAB SL: 0.4 | 8 days supply | Qty: 25 | Fill #0 | Status: TO

## 2018-09-06 MED FILL — ATORVASTATIN CALCIUM 80 MG: 80 | 30 days supply | Qty: 30 | Fill #0 | Status: TO

## 2018-09-06 MED FILL — ASPIRIN LOW DOSE 81 MG CHEW: 81 | 30 days supply | Qty: 30 | Fill #0 | Status: TO

## 2018-09-06 NOTE — Progress Notes (Signed)
Progress Note  Patient Name: Laurie Espinoza Date of Encounter: 09/06/2018  Primary Cardiologist: No primary care provider on file.   Subjective   Feeling better.  No chest pain or shortness of breath.  Breathing has improved since she was switched from Brilinta to Plavix.  Inpatient Medications    Scheduled Meds: . aspirin  81 mg Oral Daily  . atorvastatin  80 mg Oral q1800  . Chlorhexidine Gluconate Cloth  6 each Topical Daily  . clopidogrel  75 mg Oral Daily  . heparin  5,000 Units Subcutaneous Q8H  . metoprolol tartrate  25 mg Oral BID  . sodium chloride flush  3 mL Intravenous Q12H   Continuous Infusions: . sodium chloride Stopped (09/05/18 1500)  . sodium chloride     PRN Meds: sodium chloride, acetaminophen, hydrALAZINE, LORazepam, nitroGLYCERIN, ondansetron (ZOFRAN) IV, sodium chloride flush   Vital Signs    Vitals:   09/05/18 1000 09/05/18 1300 09/05/18 2035 09/06/18 0548  BP: (!) 130/98 (!) 155/90 139/82 (!) 123/93  Pulse:  72 81 72  Resp:  20    Temp:  98.2 F (36.8 C) 98.6 F (37 C) 98.3 F (36.8 C)  TempSrc:  Oral Oral Oral  SpO2: 96% 98% 95% 96%  Weight:    63.5 kg  Height:        Intake/Output Summary (Last 24 hours) at 09/06/2018 0800 Last data filed at 09/06/2018 0300 Gross per 24 hour  Intake 720 ml  Output -  Net 720 ml   Last 3 Weights 09/06/2018 09/04/2018 09/04/2018  Weight (lbs) 140 lb 1.6 oz 140 lb 6.9 oz 120 lb  Weight (kg) 63.549 kg 63.7 kg 54.432 kg      Telemetry    Normal sinus rhythm.  There was one ventricular run of 3 beats - Personally Reviewed  ECG    N/a - Personally Reviewed  Physical Exam  Alert, oriented woman in no distress GEN: No acute distress.   Neck: No JVD Cardiac: RRR, no murmurs, rubs, or gallops.  Respiratory:  Scattered rhonchi otherwise clear GI: Soft, nontender, non-distended  MS: No edema; No deformity.  Right radial site clear Neuro:  Nonfocal  Psych: Normal affect   Labs    Chemistry  Recent Labs  Lab 09/04/18 0136 09/04/18 0222 09/04/18 0542 09/05/18 0956  NA 142  --  143 139  K 3.3*  --  3.6 3.6  CL 107  --  108 107  CO2 18*  --  20* 23  GLUCOSE 139*  --  114* 113*  BUN 12  --  10 7  CREATININE 1.06* 0.60 0.73 0.75  CALCIUM 8.9  --  8.9 9.4  PROT 7.2  --  6.9  --   ALBUMIN 3.8  --  3.6 3.4*  AST 23  --  37  --   ALT 18  --  20  --   ALKPHOS 94  --  93  --   BILITOT 0.5  --  0.3  --   GFRNONAA 58*  --  >60 >60  GFRAA >60  --  >60 >60  ANIONGAP 17*  --  15 9     Hematology Recent Labs  Lab 09/04/18 0136 09/04/18 0542  WBC 7.8 10.5  RBC 5.11 5.02  HGB 16.4* 15.9*  HCT 48.5* 46.5*  MCV 94.9 92.6  MCH 32.1 31.7  MCHC 33.8 34.2  RDW 13.6 13.7  PLT 297 274    Cardiac Enzymes Recent Labs  Lab  09/04/18 0136 09/04/18 1139 09/04/18 1621 09/04/18 2327  TROPONINI <0.03 6.41* 3.63* 2.95*   No results for input(s): TROPIPOC in the last 168 hours.   BNP Recent Labs  Lab 09/04/18 0542  BNP 20.3     DDimer No results for input(s): DDIMER in the last 168 hours.   Radiology    No results found.  Cardiac Studies   Cath: 09/04/18    Anterior ST elevation myocardial infarction with total occlusion of the mid RCA resulting in TIMI grade 0 flow.  Successful PCI and stent implantation in RCA using a 3.0 x 22 Onyx resolute postdilated to 3.25 mm in diameter resulting in 0% stenosis and TIMI grade III flow.  Normal left main  Widely patent LAD which wraps around the apex.  A large diagonal contains proximal to mid 50 to 60% narrowing.  The circumflex coronary artery contains eccentric 50 to 60% proximal narrowing and 50 to 60% stenosis after the origin of the large first obtuse marginal.  Very mild inferior hypokinesis.  EF 55 to 65%.  LVEDP less than 10 mmHg.  RECOMMENDATIONS:   Aspirin and Brilinta x12 months.  Secondary risk factor modification including smoking cessation, high intensity statin therapy, screening for diabetes, and  blood pressure control as required.  Discharge should occur on Monday, 09/05/2018.  Screen for potential alcohol withdrawal  TTE: 09/04/18  IMPRESSIONS    1. The left ventricle has normal systolic function, with an ejection fraction of 55-60%. The cavity size was normal. There is mildly increased left ventricular wall thickness. Left ventricular diastolic Doppler parameters are consistent with impaired  relaxation. Moderate hypokinesis of the left ventricular, basal-mid inferior wall.  2. The right ventricle has normal systolic function. The cavity was normal. There is no increase in right ventricular wall thickness.  3. The aortic valve is tricuspid. Mild thickening of the aortic valve. Mild calcification of the aortic valve.  Patient Profile     58 y.o. female with PMH of tobacco use who presented with Inferior STEMI.  Assessment & Plan    1. Inferior STEMI: underwent cardiac cath on 4/26 s/p PCI/DES to the RCA. Other mild disease in in the left circumflex and diagonal.  Plan for DAPT with aspirin and Brilinta for at least 12 months. Troponin peaked at 6.41. Of note was not tolerating Brilinta secondary to shortness of breath.  Switch to Plavix with a 300 mg load. Echo with normal EF and moderate hypokinesis in the LV, and basal-mid inferior wall.  --Continue aspirin, statin, Plavix and beta-blocker.  2.  NSVT: K+ 3.6, Mg+ 2.0.  Currently tolerating metoprolol 25 mg twice daily.  3. HL: LDL 89. On high dose statin.   4. Tobacco use: cessation education provided via nursing staff. For questions or updates, please contact [facility] Please consult [email or web address] for contact info under     Signed, Laverda Page, NP  09/06/2018, 8:00 AM    Patient seen, examined. Available data reviewed. Agree with findings, assessment, and plan as outlined by Laverda Page, NP.  The physical exam findings outlined above reflect my personal exam findings of this patient today.  She has done  well after presenting with an inferior STEMI treated with primary PCI.  She had marked shortness of breath with ticagrelor.  She was transitioned with a loading dose of clopidogrel yesterday and her breathing has improved.  Her exam is stable and I think she is ready for discharge.  She will be discharged on aspirin, clopidogrel, beta-blocker, and a  high intensity statin drug.  Will arrange hospital follow-up.  Discussed post MI restrictions with the patient.  She has been seen by cardiac rehab.  Tonny BollmanMichael Keyri Salberg, M.D. 09/06/2018 10:09 AM

## 2018-09-06 NOTE — Progress Notes (Signed)
Cardiac Rehab Advisory Cardiac Rehab Phase I is not seeing pts face to face at this time due to Covid 19 restrictions. Ambulation is occurring through nursing, PT, and mobility teams. We will help facilitate that process as needed. We are calling pts in their rooms and discussing education. We will then deliver education materials to pts RN for delivery to pt.   [phone] Checked with pt to see if any questions re ed done yesterday. Pt had no questions. Encouraged pt to take her meds and to not smoke. Pt washing up and stated no CP. Will sign off since ed completed. Luetta Nutting RN BSN 09/06/2018 9:33 AM
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
## 2018-09-06 NOTE — TOC Transition Note (Signed)
Transition of Care [facility]) - CM/SW Discharge Note   Patient Details  Name: Laurie Espinoza MRN: [id number] Date of Birth: [birth year]  [facility] CM/SW Contact:  Gala Lewandowsky, RN Phone Number: 09/06/2018, 11:06 AM   Clinical Narrative:   MATCH completed for medication assistance to make sure patient receives medications before transitioning home. TOC will deliver medications to bedside. No further needs from CM at this time.    Final next level of care: Home/Self Care Barriers to Discharge: Continued Medical Work up   Patient Goals and CMS Choice   CMS Medicare.gov Compare Post Acute Care list provided to:: (S) (N/A) Choice offered to / list presented to : NA   Discharge Plan and Services In-house Referral: Financial Counselor Discharge Planning Services: CM Consult, Follow-up appt scheduled, Indigent Health Clinic, Medication Assistance Post Acute Care Choice: NA          DME Arranged: N/A DME Agency: NA       HH Arranged: NA HH Agency: NA  Social Determinants of Health (SDOH) Interventions     Readmission Risk Interventions No flowsheet data found.

## 2018-09-06 NOTE — Telephone Encounter (Signed)
   TELEPHONE CALL NOTE  This patient has been deemed a candidate for follow-up tele-health visit to limit community exposure during the Covid-19 pandemic. I spoke with the patient via phone to discuss instructions. This has been outlined on the patient's AVS (dotphrase: [email or web address]). The patient was advised to review the section on consent for treatment as well. The patient will receive a phone call 2-3 days prior to their E-Visit at which time consent will be verbally confirmed. A Virtual Office Visit appointment type has been scheduled for 09/14/18 with Nada Boozer, with "VIDEO" or "TELEPHONE" in the appointment notes - patient prefers TELEPHONE type.  I have either confirmed the patient is active in MyChart or offered to send sign-up link to phone/email via Mychart icon beside patient's photo.  Laverda Page, NP 09/06/2018 10:30 AM

## 2018-09-06 NOTE — Discharge Instructions (Signed)
YOUR CARDIOLOGY TEAM HAS ARRANGED FOR AN E-VISIT FOR YOUR APPOINTMENT - PLEASE REVIEW IMPORTANT INFORMATION BELOW SEVERAL DAYS PRIOR TO YOUR APPOINTMENT  Due to the recent COVID-19 pandemic, we are transitioning in-person office visits to tele-medicine visits in an effort to decrease unnecessary exposure to our patients, their families, and staff. These visits are billed to your insurance just like a normal visit is. We also encourage you to sign up for MyChart if you have not already done so. You will need a smartphone if possible. For patients that do not have this, we can still complete the visit using a regular telephone but do prefer a smartphone to enable video when possible. You may have a family member that lives with you that can help. If possible, we also ask that you have a blood pressure cuff and scale at home to measure your blood pressure, heart rate and weight prior to your scheduled appointment. Patients with clinical needs that need an in-person evaluation and testing will still be able to come to the office if absolutely necessary. If you have any questions, feel free to call our office.     YOUR PROVIDER WILL BE USING THE FOLLOWING PLATFORM TO COMPLETE YOUR VISIT: TELEPHONE VISIT   IF USING MYCHART - How to Download the MyChart App to Your SmartPhone   - If Apple, go to [employer] and type in MyChart in the search bar and download the app. If Android, ask patient to go to [employer] and type in [place] in the search bar and download the app. The app is free but as with any other app downloads, your phone may require you to verify saved payment information or Apple/Android password.  - You will need to then log into the app with your MyChart username and password, and select [facility] as your healthcare provider to link the account.  - When it is time for your visit, go to the MyChart app, find appointments, and click Begin Video Visit. Be sure to Select Allow for your device  to access the Microphone and Camera for your visit. You will then be connected, and your provider will be with you shortly.  **If you have any issues connecting or need assistance, please contact MyChart service desk [phone])**  **If using a computer, in order to ensure the best quality for your visit, you will need to use either of the following Internet Browsers: Agricultural consultant or Microsoft Edge**   IF USING DOXIMITY or [email or web address].ME - The staff will give you instructions on receiving your link to join the meeting the day of your visit.      2-3 DAYS BEFORE YOUR APPOINTMENT  You will receive a telephone call from one of our [facility] team members - your caller ID may say "Unknown caller." If this is a video visit, we will walk you through how to get the video launched on your phone. We will remind you check your blood pressure, heart rate and weight prior to your scheduled appointment. If you have an Apple Watch or Kardia, please upload any pertinent ECG strips the day before or morning of your appointment to MyChart. Our staff will also make sure you have reviewed the consent and agree to move forward with your scheduled tele-health visit.     THE DAY OF YOUR APPOINTMENT  Approximately 15 minutes prior to your scheduled appointment, you will receive a telephone call from one of HeartCare team - your caller ID may say "Unknown caller."  Our staff will confirm medications, vital signs for the day and any symptoms you may be experiencing. Please have this information available prior to the time of visit start. It may also be helpful for you to have a pad of paper and pen handy for any instructions given during your visit. They will also walk you through joining the smartphone meeting if this is a video visit.    CONSENT FOR TELE-HEALTH VISIT - PLEASE REVIEW  I hereby voluntarily request, consent and authorize [facility] and its employed or contracted physicians, physician  assistants, nurse practitioners or other licensed health care professionals (the Practitioner), to provide me with telemedicine health care services (the Services") as deemed necessary by the treating Practitioner. I acknowledge and consent to receive the Services by the Practitioner via telemedicine. I understand that the telemedicine visit will involve communicating with the Practitioner through live audiovisual communication technology and the disclosure of certain medical information by electronic transmission. I acknowledge that I have been given the opportunity to request an in-person assessment or other available alternative prior to the telemedicine visit and am voluntarily participating in the telemedicine visit.  I understand that I have the right to withhold or withdraw my consent to the use of telemedicine in the course of my care at any time, without affecting my right to future care or treatment, and that the Practitioner or I may terminate the telemedicine visit at any time. I understand that I have the right to inspect all information obtained and/or recorded in the course of the telemedicine visit and may receive copies of available information for a reasonable fee.  I understand that some of the potential risks of receiving the Services via telemedicine include:   Delay or interruption in medical evaluation due to technological equipment failure or disruption;  Information transmitted may not be sufficient (e.g. poor resolution of images) to allow for appropriate medical decision making by the Practitioner; and/or   In rare instances, security protocols could fail, causing a breach of personal health information.  Furthermore, I acknowledge that it is my responsibility to provide information about my medical history, conditions and care that is complete and accurate to the best of my ability. I acknowledge that Practitioner's advice, recommendations, and/or decision may be based on  factors not within their control, such as incomplete or inaccurate data provided by me or distortions of diagnostic images or specimens that may result from electronic transmissions. I understand that the practice of medicine is not an exact science and that Practitioner makes no warranties or guarantees regarding treatment outcomes. I acknowledge that I will receive a copy of this consent concurrently upon execution via email to the email address I last provided but may also request a printed copy by calling the office of [facility].    I understand that my insurance will be billed for this visit.   I have read or had this consent read to me.  I understand the contents of this consent, which adequately explains the benefits and risks of the Services being provided via telemedicine.   I have been provided ample opportunity to ask questions regarding this consent and the Services and have had my questions answered to my satisfaction.  I give my informed consent for the services to be provided through the use of telemedicine in my medical care  By participating in this telemedicine visit I agree to the above.    Coronary Angioplasty, Care After This sheet gives you information about how to  care for yourself after your procedure. Your health care provider may also give you more specific instructions. If you have problems or questions, contact your health care provider. What can I expect after the procedure? After your procedure, it is common to have:  Bruising at the catheter insertion site. This usually fades within 1-2 weeks.  Blood collecting in the tissue (hematoma) that may be painful to the touch. It should become smaller and less tender within 1-2 weeks. Follow these instructions at home: Medicines  Take over-the-counter and prescription medicines only as told by your health care provider.  Blood thinners may be prescribed after your procedure to improve blood  flow. Bathing  You may shower 24-48 hours after the procedure or as told by your health care provider.  Do not take baths, swim, or use a hot tub until your health care provider approves. Insertion site care   Follow instructions from your health care provider about how to take care of your insertion site. Make sure you: ? Wash your hands with soap and water before you change your bandage (dressing). If soap and water are not available, use hand sanitizer. ? Change your dressing as told by your health care provider. ? Gently wash the site with plain soap and water. ? Use a clean towel to pat the area dry. ? Do not rub the site, because this may cause bleeding. ? Do not apply powder or lotion to the site.  Check your insertion site every day for signs of infection. Check for: ? More redness, swelling, or pain. ? More fluid or blood. ? Warmth. ? Pus or a bad smell. Lifestyle   Make any lifestyle changes as recommended by your health care provider. This may include: ? Not using any products that contain nicotine or tobacco, such as cigarettes and e-cigarettes. If you need help quitting, ask your health care provider. ? Managing your weight. ? Getting regular exercise. ? Managing your blood pressure. ? Limiting your alcohol intake. ? Managing other health problems, such as diabetes.  Eat a heart-healthy diet. This should include plenty of fresh fruits and vegetables. Avoid foods that are: ? High in salt (sodium). ? Canned or highly processed. ? High in saturated fat or sugar. ? Fried. General instructions  Do not lift over 10 lb (4.5 kg) for 5 days after your procedure or as told by your health care provider.  Ask your health care provider when it is okay to: ? Return to work or school. ? Resume usual physical activities or sports. ? Resume sexual activity.  Keep all follow-up visits as told by your health care provider. This is important. Contact a health care provider  if:  You have a fever.  You have chills.  You have increased bleeding from the insertion site. Hold pressure on the site. Get help right away if:  You develop chest pain or shortness of breath, feel faint, or pass out.  You have unusual pain at the insertion site.  You have redness, warmth, or swelling at the insertion site.  You have drainage (other than a small amount of blood on the dressing) from the insertion site.  The insertion site is bleeding, and the bleeding does not stop after 30 minutes of holding steady pressure on the site.  You develop bleeding from any other place, such as from the rectum. There may be bright red blood in your urine or stool, or it may appear as black, tarry stool. This information is not intended  to replace advice given to you by your health care provider. Make sure you discuss any questions you have with your health care provider. Document Released: 11/13/2004 Document Revised: 08/24/2016 Document Reviewed: 12/01/2015 Elsevier Interactive Patient Education  2019 [employer].
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
## 2018-09-14 ENCOUNTER — Other Ambulatory Visit: Payer: Self-pay

## 2018-09-14 ENCOUNTER — Telehealth (INDEPENDENT_AMBULATORY_CARE_PROVIDER_SITE_OTHER): Payer: Self-pay | Admitting: Cardiology

## 2018-09-14 ENCOUNTER — Encounter: Payer: Self-pay | Admitting: Cardiology

## 2018-09-14 ENCOUNTER — Telehealth (HOSPITAL_COMMUNITY): Payer: Self-pay | Admitting: *Deleted

## 2018-09-14 VITALS — Ht 61.0 in | Wt 140.0 lb

## 2018-09-14 DIAGNOSIS — I2510 Atherosclerotic heart disease of native coronary artery without angina pectoris: Secondary | ICD-10-CM

## 2018-09-14 DIAGNOSIS — Z79899 Other long term (current) drug therapy: Secondary | ICD-10-CM

## 2018-09-14 DIAGNOSIS — E785 Hyperlipidemia, unspecified: Secondary | ICD-10-CM

## 2018-09-14 DIAGNOSIS — Z720 Tobacco use: Secondary | ICD-10-CM

## 2018-09-14 DIAGNOSIS — I249 Acute ischemic heart disease, unspecified: Secondary | ICD-10-CM

## 2018-09-14 NOTE — Patient Instructions (Addendum)
Medication Instructions:  Your physician recommends that you continue on your current medications as directed. Please refer to the Current Medication list given to you today.  If you need a refill on your cardiac medications before your next appointment, please call your pharmacy.   Lab work: 10/12/2018:  FASTING LIPID & CMP (NOTHING TO EAT OR DRINK AFTER MIDNIGHT THE NIGHT BEFORE)  If you have labs (blood work) drawn today and your tests are completely normal, you will receive your results only by: Marland Kitchen MyChart Message (if you have MyChart) OR . A paper copy in the mail If you have any lab test that is abnormal or we need to change your treatment, we will call you to review the results.  Testing/Procedures: None ordered   Follow-Up: At [facility], you and your health needs are our priority.  As part of our continuing mission to provide you with exceptional heart care, we have created designated Provider Care Teams.  These Care Teams include your primary Cardiologist (physician) and Advanced Practice Providers (APPs -  Physician Assistants and Nurse Practitioners) who all work together to provide you with the care you need, when you need it. You will need a follow up appointment WITH DR. Katrinka Blazing 12/23/2018.  THIS IS AN IN OFFICE VISIT UNLESS YOU HEAR OTHERWISE  Any Other Special Instructions Will Be Listed Below (If Applicable). [facility] AND CHECK YOUR BLOOD PRESSURE AND CALL us BACK WITH IT.
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
## 2018-09-14 NOTE — Telephone Encounter (Signed)
Unable to reach pt on home phone for inquiry of how she is doing generally an  Cardiac Rehab continued closure due to adherence of national recommendation for Covid-19 in group setting. Would like to pinpoint where she is in the Medicaid process.  Pt has upcoming appt on tomorrow with [facility].   Immediately rang busy.  Called mobile number female who did not identify herself indicated pt had gone to the store.  Requested call back.Alanson Aly, BSN Cardiac and Emergency planning/management officer

## 2018-09-15 ENCOUNTER — Ambulatory Visit: Payer: Self-pay | Attending: Family Medicine

## 2018-09-15 ENCOUNTER — Other Ambulatory Visit: Payer: Self-pay

## 2018-10-10 ENCOUNTER — Telehealth: Payer: Self-pay | Admitting: *Deleted

## 2018-10-10 NOTE — Telephone Encounter (Signed)
    COVID-19 Pre-Screening Questions:  . In the past 7 to 10 days have you had a cough,  shortness of breath, headache, congestion, fever (100 or greater) body aches, chills, sore throat, or sudden loss of taste or sense of smell? . Have you been around anyone with known Covid 19. . Have you been around anyone who is awaiting Covid 19 test results in the past 7 to 10 days? . Have you been around anyone who has been exposed to Covid 19, or has mentioned symptoms of Covid 19 within the past 7 to 10 days?  If you have any concerns/questions about symptoms patients report during screening (either on the phone or at threshold). Contact the provider seeing the patient or DOD for further guidance.  If neither are available contact a member of the leadership team.  Contacted patient via telephone call. All Covid 19 Questions were no. Patient has a mask. KB

## 2018-10-12 ENCOUNTER — Other Ambulatory Visit: Payer: Self-pay

## 2018-10-12 ENCOUNTER — Telehealth: Payer: Self-pay | Admitting: *Deleted

## 2018-10-13 ENCOUNTER — Other Ambulatory Visit: Payer: Self-pay | Admitting: *Deleted

## 2018-10-13 DIAGNOSIS — I249 Acute ischemic heart disease, unspecified: Secondary | ICD-10-CM

## 2018-10-13 DIAGNOSIS — E785 Hyperlipidemia, unspecified: Secondary | ICD-10-CM

## 2018-10-13 DIAGNOSIS — Z79899 Other long term (current) drug therapy: Secondary | ICD-10-CM

## 2018-10-13 LAB — COMPREHENSIVE METABOLIC PANEL
ALT: 32 IU/L (ref 0–32)
AST: 28 IU/L (ref 0–40)
Albumin/Globulin Ratio: 1.5 (ref 1.2–2.2)
Albumin: 4.4 g/dL (ref 3.8–4.9)
Alkaline Phosphatase: 121 IU/L — ABNORMAL HIGH (ref 39–117)
BUN/Creatinine Ratio: 12 (ref 9–23)
BUN: 11 mg/dL (ref 6–24)
Bilirubin Total: 0.3 mg/dL (ref 0.0–1.2)
CO2: 26 mmol/L (ref 20–29)
Calcium: 9.8 mg/dL (ref 8.7–10.2)
Chloride: 101 mmol/L (ref 96–106)
Creatinine, Ser: 0.91 mg/dL (ref 0.57–1.00)
GFR calc Af Amer: 80 mL/min/{1.73_m2} (ref >59)
GFR calc non Af Amer: 70 mL/min/{1.73_m2} (ref >59)
Globulin, Total: 2.9 g/dL (ref 1.5–4.5)
Glucose: 94 mg/dL (ref 65–99)
Potassium: 4.5 mmol/L (ref 3.5–5.2)
Sodium: 141 mmol/L (ref 134–144)
Total Protein: 7.3 g/dL (ref 6.0–8.5)

## 2018-10-13 LAB — LIPID PANEL
Chol/HDL Ratio: 3.1 ratio (ref 0.0–4.4)
Cholesterol, Total: 173 mg/dL (ref 100–199)
HDL: 56 mg/dL (ref >39)
LDL Calculated: 77 mg/dL (ref 0–99)
Triglycerides: 201 mg/dL — ABNORMAL HIGH (ref 0–149)
VLDL Cholesterol Cal: 40 mg/dL (ref 5–40)

## 2018-10-14 ENCOUNTER — Telehealth: Payer: Self-pay | Admitting: *Deleted

## 2018-10-14 NOTE — Telephone Encounter (Signed)
-----   Message from Leone Brand, NP sent at 10/14/2018  8:15 AM EDT ----- Cholesterol -bad cholesterol still a tad high, watch diet closer, less fat and glucose decrease sweets, white breads and white potatoes.

## 2018-10-14 NOTE — Telephone Encounter (Signed)
Call placed to pt re: lab results, left a message for pt to call back.  

## 2018-11-04 ENCOUNTER — Encounter: Payer: Self-pay | Admitting: *Deleted

## 2018-11-04 ENCOUNTER — Telehealth (HOSPITAL_COMMUNITY): Payer: Self-pay

## 2018-11-04 NOTE — Telephone Encounter (Signed)
3rd attempt made to reach pt re: lab results. Letter has been mailed to pt.

## 2018-11-04 NOTE — Telephone Encounter (Signed)
No response from pt, closed referral. °

## 2018-12-21 NOTE — Progress Notes (Signed)
NO SHOW

## 2018-12-23 ENCOUNTER — Ambulatory Visit (INDEPENDENT_AMBULATORY_CARE_PROVIDER_SITE_OTHER): Payer: Self-pay | Admitting: Interventional Cardiology

## 2018-12-23 DIAGNOSIS — Z720 Tobacco use: Secondary | ICD-10-CM

## 2018-12-23 DIAGNOSIS — I2510 Atherosclerotic heart disease of native coronary artery without angina pectoris: Secondary | ICD-10-CM

## 2018-12-23 DIAGNOSIS — Z7189 Other specified counseling: Secondary | ICD-10-CM

## 2018-12-23 DIAGNOSIS — E785 Hyperlipidemia, unspecified: Secondary | ICD-10-CM

## 2018-12-23 DIAGNOSIS — J449 Chronic obstructive pulmonary disease, unspecified: Secondary | ICD-10-CM

## 2018-12-24 ENCOUNTER — Encounter: Payer: Self-pay | Admitting: Interventional Cardiology

## 2020-12-31 ENCOUNTER — Emergency Department (HOSPITAL_COMMUNITY)
Admission: EM | Admit: 2020-12-31 | Discharge: 2020-12-31 | Disposition: A | Discharge status: Home / Self Care | Payer: Self-pay | Attending: Emergency Medicine | Admitting: Emergency Medicine

## 2020-12-31 ENCOUNTER — Other Ambulatory Visit: Payer: Self-pay

## 2020-12-31 ENCOUNTER — Encounter (HOSPITAL_COMMUNITY): Payer: Self-pay | Admitting: Emergency Medicine

## 2020-12-31 ENCOUNTER — Emergency Department (HOSPITAL_COMMUNITY): Payer: Self-pay

## 2020-12-31 DIAGNOSIS — Z23 Encounter for immunization: Secondary | ICD-10-CM | POA: Insufficient documentation

## 2020-12-31 DIAGNOSIS — Z7902 Long term (current) use of antithrombotics/antiplatelets: Secondary | ICD-10-CM | POA: Insufficient documentation

## 2020-12-31 DIAGNOSIS — S301XXA Contusion of abdominal wall, initial encounter: Secondary | ICD-10-CM | POA: Insufficient documentation

## 2020-12-31 DIAGNOSIS — F17210 Nicotine dependence, cigarettes, uncomplicated: Secondary | ICD-10-CM | POA: Insufficient documentation

## 2020-12-31 DIAGNOSIS — S20211A Contusion of right front wall of thorax, initial encounter: Secondary | ICD-10-CM | POA: Insufficient documentation

## 2020-12-31 DIAGNOSIS — S51811A Laceration without foreign body of right forearm, initial encounter: Secondary | ICD-10-CM | POA: Insufficient documentation

## 2020-12-31 DIAGNOSIS — Z7982 Long term (current) use of aspirin: Secondary | ICD-10-CM | POA: Insufficient documentation

## 2020-12-31 DIAGNOSIS — S161XXA Strain of muscle, fascia and tendon at neck level, initial encounter: Secondary | ICD-10-CM | POA: Insufficient documentation

## 2020-12-31 DIAGNOSIS — T148XXA Other injury of unspecified body region, initial encounter: Secondary | ICD-10-CM

## 2020-12-31 DIAGNOSIS — J449 Chronic obstructive pulmonary disease, unspecified: Secondary | ICD-10-CM | POA: Insufficient documentation

## 2020-12-31 DIAGNOSIS — Z79899 Other long term (current) drug therapy: Secondary | ICD-10-CM | POA: Insufficient documentation

## 2020-12-31 DIAGNOSIS — R519 Headache, unspecified: Secondary | ICD-10-CM | POA: Insufficient documentation

## 2020-12-31 LAB — COMPREHENSIVE METABOLIC PANEL
ALT: 19 U/L (ref 0–44)
AST: 26 U/L (ref 15–41)
Albumin: 3.5 g/dL (ref 3.5–5.0)
Alkaline Phosphatase: 91 U/L (ref 38–126)
Anion gap: 9 (ref 5–15)
BUN: 9 mg/dL (ref 6–20)
CO2: 23 mmol/L (ref 22–32)
Calcium: 8.7 mg/dL — ABNORMAL LOW (ref 8.9–10.3)
Chloride: 106 mmol/L (ref 98–111)
Creatinine, Ser: 0.76 mg/dL (ref 0.44–1.00)
GFR, Estimated: >60 mL/min (ref >60)
Glucose, Bld: 98 mg/dL (ref 70–99)
Potassium: 3.6 mmol/L (ref 3.5–5.1)
Sodium: 138 mmol/L (ref 135–145)
Total Bilirubin: 0.2 mg/dL — ABNORMAL LOW (ref 0.3–1.2)
Total Protein: 6.4 g/dL — ABNORMAL LOW (ref 6.5–8.1)

## 2020-12-31 LAB — I-STAT CHEM 8, ED
BUN: 10 mg/dL (ref 6–20)
Calcium, Ion: 1.16 mmol/L (ref 1.15–1.40)
Chloride: 106 mmol/L (ref 98–111)
Creatinine, Ser: 0.7 mg/dL (ref 0.44–1.00)
Glucose, Bld: 96 mg/dL (ref 70–99)
HCT: 40 % (ref 36.0–46.0)
Hemoglobin: 13.6 g/dL (ref 12.0–15.0)
Potassium: 3.7 mmol/L (ref 3.5–5.1)
Sodium: 141 mmol/L (ref 135–145)
TCO2: 25 mmol/L (ref 22–32)

## 2020-12-31 LAB — CBC WITH DIFFERENTIAL/PLATELET
Abs Immature Granulocytes: 0.01 10*3/uL (ref 0.00–0.07)
Basophils Absolute: 0 10*3/uL (ref 0.0–0.1)
Basophils Relative: 1 %
Eosinophils Absolute: 0.1 10*3/uL (ref 0.0–0.5)
Eosinophils Relative: 2 %
HCT: 39.5 % (ref 36.0–46.0)
Hemoglobin: 13.2 g/dL (ref 12.0–15.0)
Immature Granulocytes: 0 %
Lymphocytes Relative: 40 %
Lymphs Abs: 1.8 10*3/uL (ref 0.7–4.0)
MCH: 32.4 pg (ref 26.0–34.0)
MCHC: 33.4 g/dL (ref 30.0–36.0)
MCV: 97.1 fL (ref 80.0–100.0)
Monocytes Absolute: 0.5 10*3/uL (ref 0.1–1.0)
Monocytes Relative: 11 %
Neutro Abs: 2.1 10*3/uL (ref 1.7–7.7)
Neutrophils Relative %: 46 %
Platelets: 295 10*3/uL (ref 150–400)
RBC: 4.07 MIL/uL (ref 3.87–5.11)
RDW: 14.6 % (ref 11.5–15.5)
WBC: 4.5 10*3/uL (ref 4.0–10.5)
nRBC: 0 % (ref 0.0–0.2)

## 2020-12-31 LAB — LIPASE, BLOOD: Lipase: 27 U/L (ref 11–51)

## 2020-12-31 LAB — I-STAT BETA HCG BLOOD, ED (MC, WL, AP ONLY): I-stat hCG, quantitative: <5 m[IU]/mL (ref <5)

## 2020-12-31 MED ORDER — FENTANYL CITRATE PF 50 MCG/ML IJ SOSY
50.0000 ug | PREFILLED_SYRINGE | Freq: Once | INTRAMUSCULAR | Status: AC
  Administered 2020-12-31: 50 ug via INTRAVENOUS
  Filled 2020-12-31: qty 1

## 2020-12-31 MED ORDER — TETANUS-DIPHTH-ACELL PERTUSSIS 5-2.5-18.5 LF-MCG/0.5 IM SUSY
0.5000 mL | PREFILLED_SYRINGE | Freq: Once | INTRAMUSCULAR | Status: AC
  Administered 2020-12-31: 0.5 mL via INTRAMUSCULAR
  Filled 2020-12-31: qty 0.5

## 2020-12-31 MED ORDER — IOHEXOL 300 MG/ML  SOLN
75.0000 mL | Freq: Once | INTRAMUSCULAR | Status: AC | PRN
  Administered 2020-12-31: 75 mL via INTRAVENOUS
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
## 2020-12-31 NOTE — ED Notes (Signed)
Neurology at bedside at this time.
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
## 2020-12-31 NOTE — Discharge Instructions (Addendum)
Follow-up with your primary care doctor or Dr. Katrinka Blazing for recheck on your blood pressure.  You can use Tylenol for symptomatic relief.  Return here as needed if you have any worsening symptoms.
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
## 2020-12-31 NOTE — ED Notes (Addendum)
Pt provided with sandwich bag and soda per Dr Fredderick Phenix ok

## 2021-01-23 IMAGING — DX PORTABLE CHEST - 1 VIEW
1 series · 1 of 1 positions shown · non-contrast
Comparison: May 12, 2016

CLINICAL DATA: STEMI.

EXAM:
PORTABLE CHEST 1 VIEW

[chest ap]
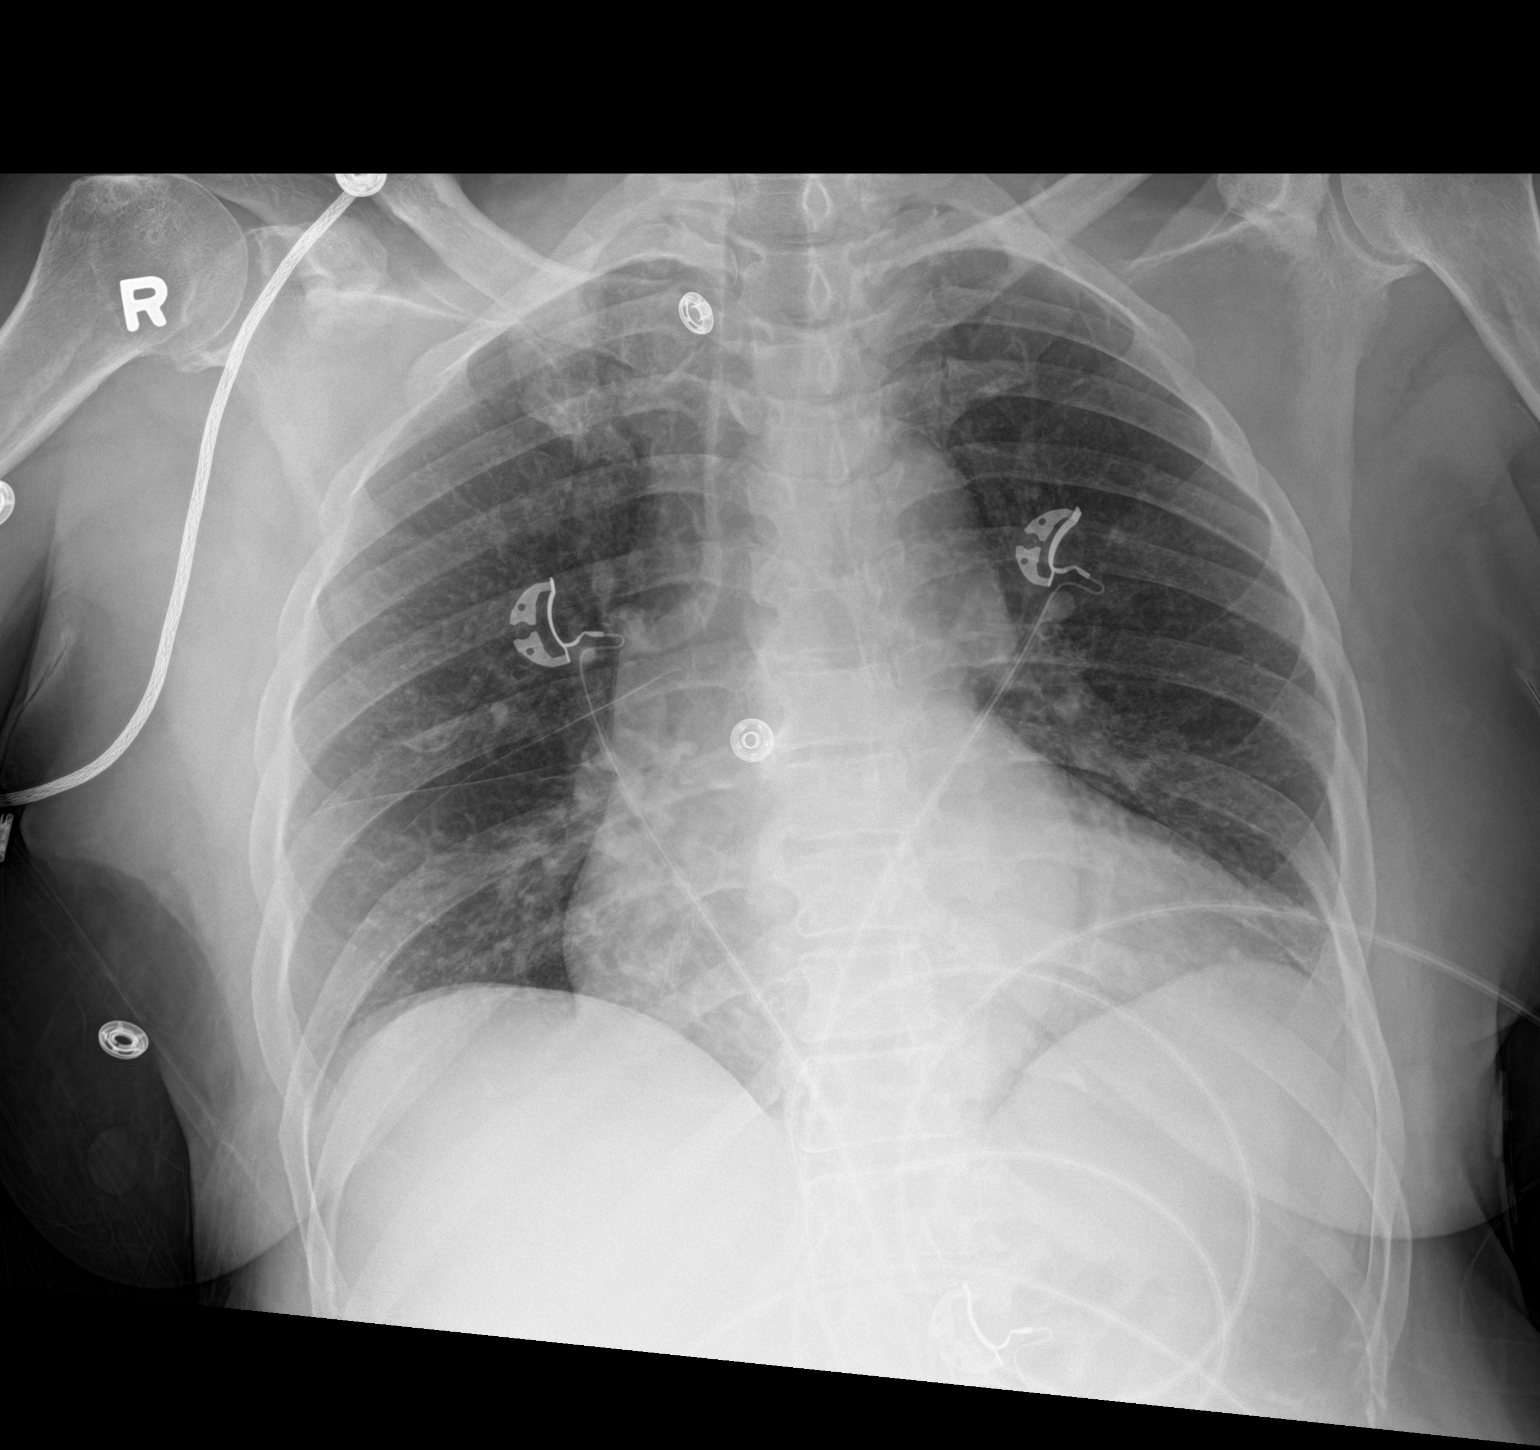

[1 of 1 positions shown; findings below may reference images not displayed]

FINDINGS: Stable cardiomegaly. The hila and mediastinum are stable. No
pneumothorax. No nodules, masses, focal infiltrates, or edema.
IMPRESSION: No active disease.

## 2022-09-01 ENCOUNTER — Emergency Department (HOSPITAL_COMMUNITY): Payer: 59

## 2022-09-01 ENCOUNTER — Other Ambulatory Visit: Payer: Self-pay

## 2022-09-01 ENCOUNTER — Ambulatory Visit (HOSPITAL_COMMUNITY)
Admission: EM | Admit: 2022-09-01 | Discharge: 2022-09-01 | Disposition: A | Discharge status: Home / Self Care | Payer: 59 | Attending: Family Medicine | Admitting: Family Medicine

## 2022-09-01 ENCOUNTER — Encounter (HOSPITAL_COMMUNITY): Payer: Self-pay | Admitting: *Deleted

## 2022-09-01 ENCOUNTER — Emergency Department (HOSPITAL_COMMUNITY)
Admission: EM | Admit: 2022-09-01 | Discharge: 2022-09-01 | Disposition: A | Discharge status: Home / Self Care | Payer: 59 | Attending: Emergency Medicine | Admitting: Emergency Medicine

## 2022-09-01 ENCOUNTER — Encounter (HOSPITAL_COMMUNITY): Payer: Self-pay | Admitting: Emergency Medicine

## 2022-09-01 ENCOUNTER — Emergency Department (HOSPITAL_COMMUNITY): Admission: EM | Admit: 2022-09-01 | Discharge: 2022-09-01 | Discharge status: Against Medical Advice | Payer: 59

## 2022-09-01 DIAGNOSIS — I1A0 Resistant hypertension: Secondary | ICD-10-CM | POA: Insufficient documentation

## 2022-09-01 DIAGNOSIS — R519 Headache, unspecified: Secondary | ICD-10-CM

## 2022-09-01 DIAGNOSIS — I10 Essential (primary) hypertension: Secondary | ICD-10-CM | POA: Insufficient documentation

## 2022-09-01 DIAGNOSIS — R42 Dizziness and giddiness: Secondary | ICD-10-CM

## 2022-09-01 HISTORY — DX: Essential (primary) hypertension: I10

## 2022-09-01 LAB — I-STAT CHEM 8, ED
BUN: 11 mg/dL (ref 8–23)
Calcium, Ion: 1.14 mmol/L — ABNORMAL LOW (ref 1.15–1.40)
Chloride: 104 mmol/L (ref 98–111)
Creatinine, Ser: 0.9 mg/dL (ref 0.44–1.00)
Glucose, Bld: 85 mg/dL (ref 70–99)
HCT: 46 % (ref 36.0–46.0)
Hemoglobin: 15.6 g/dL — ABNORMAL HIGH (ref 12.0–15.0)
Potassium: 4.4 mmol/L (ref 3.5–5.1)
Sodium: 140 mmol/L (ref 135–145)
TCO2: 29 mmol/L (ref 22–32)

## 2022-09-01 LAB — CBC WITH DIFFERENTIAL/PLATELET
Abs Immature Granulocytes: 0.01 10*3/uL (ref 0.00–0.07)
Basophils Absolute: 0 10*3/uL (ref 0.0–0.1)
Basophils Relative: 0 %
Eosinophils Absolute: 0.1 10*3/uL (ref 0.0–0.5)
Eosinophils Relative: 1 %
HCT: 46.5 % — ABNORMAL HIGH (ref 36.0–46.0)
Hemoglobin: 15.9 g/dL — ABNORMAL HIGH (ref 12.0–15.0)
Immature Granulocytes: 0 %
Lymphocytes Relative: 34 %
Lymphs Abs: 1.6 10*3/uL (ref 0.7–4.0)
MCH: 34 pg (ref 26.0–34.0)
MCHC: 34.2 g/dL (ref 30.0–36.0)
MCV: 99.4 fL (ref 80.0–100.0)
Monocytes Absolute: 0.4 10*3/uL (ref 0.1–1.0)
Monocytes Relative: 8 %
Neutro Abs: 2.7 10*3/uL (ref 1.7–7.7)
Neutrophils Relative %: 57 %
Platelets: 418 10*3/uL — ABNORMAL HIGH (ref 150–400)
RBC: 4.68 MIL/uL (ref 3.87–5.11)
RDW: 14.9 % (ref 11.5–15.5)
WBC: 4.8 10*3/uL (ref 4.0–10.5)
nRBC: 0 % (ref 0.0–0.2)

## 2022-09-01 LAB — TROPONIN I (HIGH SENSITIVITY): Troponin I (High Sensitivity): 4 ng/L (ref <18)

## 2022-09-01 MED ORDER — DIPHENHYDRAMINE HCL 50 MG/ML IJ SOLN
25.0000 mg | Freq: Once | INTRAMUSCULAR | Status: AC
  Administered 2022-09-01: 25 mg via INTRAVENOUS
  Filled 2022-09-01: qty 1

## 2022-09-01 MED ORDER — AMLODIPINE BESYLATE 5 MG PO TABS: 5.0000 mg | ORAL_TABLET | Freq: Every day | ORAL | 0 refills | Status: AC

## 2022-09-01 MED ORDER — PROCHLORPERAZINE EDISYLATE 10 MG/2ML IJ SOLN
5.0000 mg | Freq: Once | INTRAMUSCULAR | Status: AC
  Administered 2022-09-01: 5 mg via INTRAVENOUS
  Filled 2022-09-01: qty 2

## 2022-09-01 MED ORDER — AMLODIPINE BESYLATE 5 MG PO TABS
5.0000 mg | ORAL_TABLET | Freq: Once | ORAL | Status: AC
  Administered 2022-09-01: 5 mg via ORAL
  Filled 2022-09-01: qty 1

## 2022-09-01 MED ORDER — ACETAMINOPHEN 500 MG PO TABS
1000.0000 mg | ORAL_TABLET | Freq: Once | ORAL | Status: AC
  Administered 2022-09-01: 1000 mg via ORAL
  Filled 2022-09-01: qty 2

## 2022-09-01 NOTE — ED Notes (Signed)
Called for triage x2 and unable to locate in lobby./

## 2022-09-01 NOTE — Discharge Instructions (Signed)
Have your son drive you to the emergency room for further evaluation

## 2022-09-01 NOTE — ED Notes (Signed)
Pt A&Ox4 ambulatory at d/c with independent steady gait. Pt verbalized understanding of d/c instructions, prescription and follow up care. 

## 2022-09-01 NOTE — ED Triage Notes (Signed)
Pt reports that she has had a headache since Sunday.  Pt also reports that she has not been taking her BP meds for the past 2.5 years and her BP has been up.  No focal weakness.

## 2022-09-01 NOTE — ED Notes (Signed)
Patient is being discharged from the Urgent Care and sent to the Emergency Department via POV with son . Per Dr. Marlinda Mike, patient is in need of higher level of care due to hypertensive, headache, and dizziness. Patient is aware and verbalizes understanding of plan of care.  Vitals:   09/01/22 1057  BP: (!) 165/112  Pulse: 77  Resp: 17  Temp: 98.1 F (36.7 C)  SpO2: 98%

## 2022-09-01 NOTE — ED Provider Notes (Signed)
[place] EMERGENCY DEPARTMENT AT [facility] Provider Note   CSN: [id number] Arrival date & time: 09/01/22  1210     History Chief Complaint  Patient presents with   Headache    HPI Laurie Espinoza is a 62 y.o. female presenting for chief complaint of headache.  She is also had high blood pressure. She is 62 year old female with a history of ACS who followed with cardiology for years.  Today she is presenting for headache and episodic dizziness.  Denies fevers chills nausea vomiting syncope or shortness of breath.  No known sick contacts.  Otherwise ambulatory tolerating p.o. intake. States that after her heart attack 4 years ago, she was lost to follow-up and stopped taking all of her medications including her antihypertensives.  Her blood pressure has been in the 170s over the 110s at home all week this week and she is having intermittent headaches.  Currently not present.   Patient's recorded medical, surgical, social, medication list and allergies were reviewed in the Snapshot window as part of the initial history.   Review of Systems   Review of Systems  Constitutional:  Negative for chills and fever.  HENT:  Negative for ear pain and sore throat.   Eyes:  Negative for pain and visual disturbance.  Respiratory:  Negative for cough and shortness of breath.   Cardiovascular:  Negative for chest pain and palpitations.  Gastrointestinal:  Negative for abdominal pain and vomiting.  Genitourinary:  Negative for dysuria and hematuria.  Musculoskeletal:  Negative for arthralgias and back pain.  Skin:  Negative for color change and rash.  Neurological:  Positive for dizziness and headaches. Negative for seizures and syncope.  All other systems reviewed and are negative.   Physical Exam Updated Vital Signs BP (!) 164/91   Pulse 74   Temp 98 F (36.7 C) (Oral)   Resp 15   SpO2 98%  Physical Exam Vitals and nursing note reviewed.  Constitutional:      General: She  is not in acute distress.    Appearance: She is well-developed.  HENT:     Head: Normocephalic and atraumatic.  Eyes:     Conjunctiva/sclera: Conjunctivae normal.  Cardiovascular:     Rate and Rhythm: Normal rate and regular rhythm.     Heart sounds: No murmur heard. Pulmonary:     Effort: Pulmonary effort is normal. No respiratory distress.     Breath sounds: Normal breath sounds.  Abdominal:     General: There is no distension.     Palpations: Abdomen is soft.     Tenderness: There is no abdominal tenderness. There is no right CVA tenderness or left CVA tenderness.  Musculoskeletal:        General: No swelling or tenderness. Normal range of motion.     Cervical back: Neck supple.  Skin:    General: Skin is warm and dry.  Neurological:     General: No focal deficit present.     Mental Status: She is alert and oriented to person, place, and time. Mental status is at baseline.     Cranial Nerves: No cranial nerve deficit.      ED Course/ Medical Decision Making/ A&P    Procedures Procedures   Medications Ordered in ED Medications  prochlorperazine (COMPAZINE) injection 5 mg (5 mg Intravenous Given 09/01/22 1444)  diphenhydrAMINE (BENADRYL) injection 25 mg (25 mg Intravenous Given 09/01/22 1446)  acetaminophen (TYLENOL) tablet 1,000 mg (1,000 mg Oral Given 09/01/22 1449)  amLODipine (  NORVASC) tablet 5 mg (5 mg Oral Given 09/01/22 1638)   Medical Decision Making:   Laurie Espinoza is a 62 y.o. female who presented to the ED today with elevated BP readings detailed above.    Complete initial physical exam performed, notably the patient  was HDS in NAD.    Reviewed and confirmed nursing documentation for past medical history, family history, social history.    Initial Assessment:   With the patient's presentation of elevated blood pressure readings, most likely diagnosis is hypertensive urgency. Other diagnoses associated with hypertensive emergency were considered including (but  not limited to) intracranial hemorrhage, acute renal artery stenosis, acute kidney injury, myocardial stress, ophthalmologic emergencies. These are considered less likely due to history of present illness and physical exam findings.   This is most consistent with an acute life/limb threatening illness complicated by underlying chronic conditions. Will evaluate for hypertensive emergency as below.  Initial Plan:  Will symptomatically treat her headache with Compazine, Benadryl, Tylenol and plan for reassessment Screening labs including CBC and Metabolic panel to evaluate for infectious or metabolic etiology of disease.  Given headache, eval for ICH with CTH EKG/Troponin to evaluate for cardiac pathology. Objective evaluation as below reviewed. Considered further administration of antihypertensives in ED, per consensus guidelines for [facility] of emergency physicians, acute treatment of hypertensive urgency alone in the emergency department is not recommended.  If patient has evidence of hypertensive emergency on objective laboratory evaluation, will reevaluate.  Will monitor blood pressure while patient awaiting above laboratory studies.  Initial Study Results:   Laboratory  All laboratory results reviewed without evidence of clinically relevant pathology.    EKG EKG was reviewed independently. Rate, rhythm, axis, intervals all examined and without medically relevant abnormality. ST segments without concerns for elevations.    Radiology:  All images reviewed independently. Agree with radiology report at this time.   CT HEAD WO CONTRAST ( )  Result Date: 09/01/2022 CLINICAL DATA:  Headache EXAM: CT HEAD WITHOUT CONTRAST TECHNIQUE: Contiguous axial images were obtained from the base of the skull through the vertex without intravenous contrast. RADIATION DOSE REDUCTION: This exam was performed according to the departmental dose-optimization program which includes automated exposure  control, adjustment of the mA and/or kV according to patient size and/or use of iterative reconstruction technique. COMPARISON:  CT Head 12/31/20 FINDINGS: Brain: No evidence of acute infarction, hemorrhage, hydrocephalus, extra-axial collection or mass lesion/mass effect. Chronic left caudate infarct. Vascular: No hyperdense vessel or unexpected calcification. Skull: Normal. Negative for fracture or focal lesion. Sinuses/Orbits: No middle ear or mastoid effusion. Findings of chronic left sphenoid sinusitis with frothy left sphenoid sinus secretions. Visualized orbits are unremarkable. Other: None IMPRESSION: No acute intracranial abnormality Electronically Signed   By: Lorenza Cambridge M.D.   On: 09/01/2022 15:45     Final Assessment and Plan:   Patient's history of present on the symptoms exam findings do not reveal any acute pathology.  Lab work with no focal pathology.  She did remain hypertensive in the emergency department with only mild improvement.  Will start her on amlodipine at low-dose and have her follow-up with her primary care doctor for titration.  Discussed risk factors and patient consented to starting this medication at this time.  Disposition:  I have considered need for hospitalization, however, considering all of the above, I believe this patient is stable for discharge at this time.  Patient/family educated about specific return precautions for given chief complaint and symptoms.  Patient/family educated about follow-up  with PCP.     Patient/family expressed understanding of return precautions and need for follow-up. Patient spoken to regarding all imaging and laboratory results and appropriate follow up for these results. All education provided in verbal form with additional information in written form. Time was allowed for answering of patient questions. Patient discharged.    Emergency Department Medication Summary:   Medications  prochlorperazine (COMPAZINE) injection 5 mg (5  mg Intravenous Given 09/01/22 1444)  diphenhydrAMINE (BENADRYL) injection 25 mg (25 mg Intravenous Given 09/01/22 1446)  acetaminophen (TYLENOL) tablet 1,000 mg (1,000 mg Oral Given 09/01/22 1449)  amLODipine (NORVASC) tablet 5 mg (5 mg Oral Given 09/01/22 1638)     Clinical Impression:  1. Resistant hypertension      Discharge   Final Clinical Impression(s) / ED Diagnoses Final diagnoses:  Resistant hypertension    Rx / DC Orders ED Discharge Orders          Ordered    amLODipine (NORVASC) 5 MG tablet  Daily        09/01/22 1617              Glyn Ade, MD 09/01/22 1650

## 2022-09-01 NOTE — ED Notes (Signed)
Called multiples times in lobby and unable to find. Checked bathroom

## 2022-09-01 NOTE — ED Provider Notes (Signed)
[facility]    CSN: [id number] Arrival date & time: 09/01/22  1025      History   Chief Complaint Chief Complaint  Patient presents with   Headache   Dizziness    HPI Laurie Espinoza is a 62 y.o. female.    Headache Associated symptoms: dizziness   Dizziness Associated symptoms: headaches    Here for bitemporal headache and vertigo that started on April 21.  She has never had migraines previously.  She is also never had vertigo before.  She does have a history of hypertension but has not taken medication in 2 or 3 years, due to cost.  On review of the chart she has a history of heart disease and COPD.  No fever or cough  On review of prior lab work, her last EGFR was greater than 60 in 2022.  Her sugar has been normal with the last 1 being 96   She was not anemic at that time  CT scans were done in 2022 due to trauma.  At that time she had an old lacunar infarct on CT head  Past Medical History:  Diagnosis Date   COPD (chronic obstructive pulmonary disease)    Hyperlipidemia    Hypertension    STEMI (ST elevation myocardial infarction)    09/04/18 PCI/DES to RCA   Tobacco abuse     Patient Active Problem List   Diagnosis Date Noted   Hyperlipidemia 09/06/2018   Tobacco abuse 09/06/2018   ACS (acute coronary syndrome) 09/04/2018   Acute myocardial infarction 09/04/2018   ST elevation myocardial infarction (STEMI)     Past Surgical History:  Procedure Laterality Date   CORONARY STENT INTERVENTION N/A 09/04/2018   Procedure: CORONARY STENT INTERVENTION;  Surgeon: Lyn Records, MD;  Location: [facility];  Service: Cardiovascular;  Laterality: N/A;   CORONARY/GRAFT ACUTE MI REVASCULARIZATION N/A 09/04/2018   Procedure: Coronary/Graft Acute MI Revascularization;  Surgeon: Lyn Records, MD;  Location: [facility];  Service: Cardiovascular;  Laterality: N/A;   LEFT HEART CATH AND CORONARY ANGIOGRAPHY N/A 09/04/2018   Procedure: LEFT  HEART CATH AND CORONARY ANGIOGRAPHY;  Surgeon: Lyn Records, MD;  Location: [facility];  Service: Cardiovascular;  Laterality: N/A;    OB History   No obstetric history on file.      Home Medications    Prior to Admission medications   Medication Sig Start Date End Date Taking? Authorizing Provider  aspirin 81 MG chewable tablet Chew 1 tablet (81 mg total) by mouth daily. 09/07/18   Arty Baumgartner, NP  Multiple Vitamins-Minerals (WOMENS MULTIVITAMIN) TABS Take 1 tablet by mouth daily.    [provider]  Omega-3 Fatty Acids (FISH OIL) 1000 MG CAPS Take 1,000 mg by mouth daily.    [provider]  vitamin C (ASCORBIC ACID) 500 MG tablet Take 500 mg by mouth daily.    [provider]    Family History Family History  Problem Relation Age of Onset   Hypertension Mother     Social History Social History   Tobacco Use   Smoking status: Every Day    Packs/day: .5    Types: Cigarettes   Smokeless tobacco: Never  Substance Use Topics   Alcohol use: Yes   Drug use: No     Allergies   Brilinta [ticagrelor]   Review of Systems Review of Systems  Neurological:  Positive for dizziness and headaches.     Physical  Exam Triage Vital Signs ED Triage Vitals  Enc Vitals Group     BP 09/01/22 1057 (!) 165/112     Pulse Rate 09/01/22 1057 77     Resp 09/01/22 1057 17     Temp 09/01/22 1057 98.1 F (36.7 C)     Temp Source 09/01/22 1057 Oral     SpO2 09/01/22 1057 98 %     Weight --      Height --      Head Circumference --      Peak Flow --      Pain Score 09/01/22 1056 8     Pain Loc --      Pain Edu? --      Excl. in GC? --    No data found.  Updated Vital Signs BP (!) 165/112 (BP Location: Right Arm)   Pulse 77   Temp 98.1 F (36.7 C) (Oral)   Resp 17   SpO2 98%   Visual Acuity Right Eye Distance:   Left Eye Distance:   Bilateral Distance:    Right Eye Near:   Left Eye Near:    Bilateral Near:     Physical  Exam Vitals reviewed.  Constitutional:      General: She is not in acute distress.    Appearance: She is not toxic-appearing.  HENT:     Right Ear: Tympanic membrane and ear canal normal.     Left Ear: Tympanic membrane and ear canal normal.     Nose: Nose normal.     Mouth/Throat:     Mouth: Mucous membranes are moist.     Pharynx: No oropharyngeal exudate or posterior oropharyngeal erythema.  Eyes:     Conjunctiva/sclera: Conjunctivae normal.     Pupils: Pupils are equal, round, and reactive to light.  Cardiovascular:     Rate and Rhythm: Normal rate and regular rhythm.     Heart sounds: No murmur heard. Pulmonary:     Effort: Pulmonary effort is normal. No respiratory distress.     Breath sounds: No wheezing, rhonchi or rales.  Chest:     Chest wall: No tenderness.  Musculoskeletal:     Cervical back: Neck supple.  Lymphadenopathy:     Cervical: No cervical adenopathy.  Skin:    Capillary Refill: Capillary refill takes less than 2 seconds.     Coloration: Skin is not jaundiced or pale.  Neurological:     General: No focal deficit present.     Mental Status: She is alert and oriented to person, place, and time.  Psychiatric:        Behavior: Behavior normal.      UC Treatments / Results  Labs (all labs ordered are listed, but only abnormal results are displayed) Labs Reviewed - No data to display  EKG   Radiology No results found.  Procedures Procedures (including critical care time)  Medications Ordered in UC Medications - No data to display  Initial Impression / Assessment and Plan / UC Course  I have reviewed the triage vital signs and the nursing notes.  Pertinent labs & imaging results that were available during my care of the patient were reviewed by me and considered in my medical decision making (see chart for details).        I am concerned with her new vertigo and headache, that she needs evaluation in the emergency room for higher level  of evaluation and treatment then we can provide here in the urgent care setting.  She will proceed by private car with her son driving to the emergency room. Final Clinical Impressions(s) / UC Diagnoses   Final diagnoses:  Vertigo  Accelerated hypertension  Acute intractable headache, unspecified headache type     Discharge Instructions      Have your son drive you to the emergency room for further evaluation    ED Prescriptions   None    PDMP not reviewed this encounter.   Zenia Resides, MD 09/01/22 [phone]

## 2022-09-01 NOTE — ED Triage Notes (Signed)
Pt c/o headache with dizziness since Sunday. Took tylenol. Reports has HX HTN but hasn't taken medication for BP in couple years.

## 2022-09-24 ENCOUNTER — Ambulatory Visit: Payer: 59 | Admitting: Student
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
